# Patient Record
Sex: Female | Born: 1935 | Race: White | Hispanic: No | Smoking: Never smoker
Health system: Southern US, Community
[De-identification: ages and names within clinical notes are randomized; demographics above are authoritative.]

## PROBLEM LIST (undated history)

## (undated) DIAGNOSIS — J9600 Acute respiratory failure, unspecified whether with hypoxia or hypercapnia: Secondary | ICD-10-CM

## (undated) DIAGNOSIS — N183 Chronic kidney disease, stage 3 unspecified: Secondary | ICD-10-CM

## (undated) DIAGNOSIS — I4891 Unspecified atrial fibrillation: Secondary | ICD-10-CM

## (undated) DIAGNOSIS — I10 Essential (primary) hypertension: Secondary | ICD-10-CM

## (undated) DIAGNOSIS — R0600 Dyspnea, unspecified: Secondary | ICD-10-CM

## (undated) DIAGNOSIS — M1990 Unspecified osteoarthritis, unspecified site: Secondary | ICD-10-CM

## (undated) DIAGNOSIS — E119 Type 2 diabetes mellitus without complications: Secondary | ICD-10-CM

## (undated) DIAGNOSIS — E785 Hyperlipidemia, unspecified: Secondary | ICD-10-CM

## (undated) DIAGNOSIS — F0390 Unspecified dementia without behavioral disturbance: Secondary | ICD-10-CM

## (undated) DIAGNOSIS — K219 Gastro-esophageal reflux disease without esophagitis: Secondary | ICD-10-CM

## (undated) HISTORY — PX: REPLACEMENT TOTAL KNEE BILATERAL: SUR1225

## (undated) HISTORY — PX: JOINT REPLACEMENT: SHX530

## (undated) HISTORY — PX: APPENDECTOMY: SHX54

---

## 2016-11-18 DIAGNOSIS — J9600 Acute respiratory failure, unspecified whether with hypoxia or hypercapnia: Secondary | ICD-10-CM

## 2016-11-18 HISTORY — DX: Acute respiratory failure, unspecified whether with hypoxia or hypercapnia: J96.00

## 2016-12-06 ENCOUNTER — Inpatient Hospital Stay (HOSPITAL_COMMUNITY)
Admission: AD | Admit: 2016-12-06 | Discharge: 2016-12-11 | DRG: 871 | Disposition: A | Discharge status: Skilled Nursing Facility | Payer: Medicare Other | Source: Other Acute Inpatient Hospital | Attending: Internal Medicine | Admitting: Internal Medicine

## 2016-12-06 ENCOUNTER — Inpatient Hospital Stay (HOSPITAL_COMMUNITY): Payer: Medicare Other

## 2016-12-06 ENCOUNTER — Encounter (HOSPITAL_COMMUNITY): Payer: Self-pay | Admitting: Pulmonary Disease

## 2016-12-06 DIAGNOSIS — I4891 Unspecified atrial fibrillation: Secondary | ICD-10-CM | POA: Diagnosis not present

## 2016-12-06 DIAGNOSIS — I130 Hypertensive heart and chronic kidney disease with heart failure and stage 1 through stage 4 chronic kidney disease, or unspecified chronic kidney disease: Secondary | ICD-10-CM | POA: Diagnosis present

## 2016-12-06 DIAGNOSIS — R262 Difficulty in walking, not elsewhere classified: Secondary | ICD-10-CM

## 2016-12-06 DIAGNOSIS — N183 Chronic kidney disease, stage 3 (moderate): Secondary | ICD-10-CM | POA: Diagnosis present

## 2016-12-06 DIAGNOSIS — E785 Hyperlipidemia, unspecified: Secondary | ICD-10-CM | POA: Diagnosis present

## 2016-12-06 DIAGNOSIS — Z886 Allergy status to analgesic agent status: Secondary | ICD-10-CM | POA: Diagnosis not present

## 2016-12-06 DIAGNOSIS — I5041 Acute combined systolic (congestive) and diastolic (congestive) heart failure: Secondary | ICD-10-CM | POA: Diagnosis present

## 2016-12-06 DIAGNOSIS — I214 Non-ST elevation (NSTEMI) myocardial infarction: Secondary | ICD-10-CM | POA: Diagnosis present

## 2016-12-06 DIAGNOSIS — I5021 Acute systolic (congestive) heart failure: Secondary | ICD-10-CM | POA: Diagnosis not present

## 2016-12-06 DIAGNOSIS — J9621 Acute and chronic respiratory failure with hypoxia: Secondary | ICD-10-CM | POA: Diagnosis present

## 2016-12-06 DIAGNOSIS — I10 Essential (primary) hypertension: Secondary | ICD-10-CM | POA: Diagnosis not present

## 2016-12-06 DIAGNOSIS — I255 Ischemic cardiomyopathy: Secondary | ICD-10-CM | POA: Diagnosis not present

## 2016-12-06 DIAGNOSIS — J9620 Acute and chronic respiratory failure, unspecified whether with hypoxia or hypercapnia: Secondary | ICD-10-CM

## 2016-12-06 DIAGNOSIS — J189 Pneumonia, unspecified organism: Secondary | ICD-10-CM | POA: Diagnosis present

## 2016-12-06 DIAGNOSIS — Z8249 Family history of ischemic heart disease and other diseases of the circulatory system: Secondary | ICD-10-CM | POA: Diagnosis not present

## 2016-12-06 DIAGNOSIS — J181 Lobar pneumonia, unspecified organism: Secondary | ICD-10-CM | POA: Diagnosis not present

## 2016-12-06 DIAGNOSIS — I4892 Unspecified atrial flutter: Secondary | ICD-10-CM | POA: Diagnosis not present

## 2016-12-06 DIAGNOSIS — A419 Sepsis, unspecified organism: Principal | ICD-10-CM | POA: Diagnosis present

## 2016-12-06 DIAGNOSIS — J9601 Acute respiratory failure with hypoxia: Secondary | ICD-10-CM | POA: Diagnosis not present

## 2016-12-06 DIAGNOSIS — J9600 Acute respiratory failure, unspecified whether with hypoxia or hypercapnia: Secondary | ICD-10-CM

## 2016-12-06 DIAGNOSIS — E1122 Type 2 diabetes mellitus with diabetic chronic kidney disease: Secondary | ICD-10-CM | POA: Diagnosis present

## 2016-12-06 DIAGNOSIS — F0390 Unspecified dementia without behavioral disturbance: Secondary | ICD-10-CM | POA: Diagnosis present

## 2016-12-06 DIAGNOSIS — Z66 Do not resuscitate: Secondary | ICD-10-CM | POA: Diagnosis present

## 2016-12-06 DIAGNOSIS — R0600 Dyspnea, unspecified: Secondary | ICD-10-CM

## 2016-12-06 DIAGNOSIS — E872 Acidosis: Secondary | ICD-10-CM | POA: Diagnosis present

## 2016-12-06 DIAGNOSIS — Z96653 Presence of artificial knee joint, bilateral: Secondary | ICD-10-CM | POA: Diagnosis present

## 2016-12-06 DIAGNOSIS — N179 Acute kidney failure, unspecified: Secondary | ICD-10-CM | POA: Diagnosis present

## 2016-12-06 DIAGNOSIS — I272 Pulmonary hypertension, unspecified: Secondary | ICD-10-CM | POA: Diagnosis present

## 2016-12-06 DIAGNOSIS — R0602 Shortness of breath: Secondary | ICD-10-CM | POA: Diagnosis present

## 2016-12-06 HISTORY — DX: Gastro-esophageal reflux disease without esophagitis: K21.9

## 2016-12-06 HISTORY — DX: Unspecified atrial fibrillation: I48.91

## 2016-12-06 HISTORY — DX: Unspecified dementia, unspecified severity, without behavioral disturbance, psychotic disturbance, mood disturbance, and anxiety: F03.90

## 2016-12-06 HISTORY — DX: Dyspnea, unspecified: R06.00

## 2016-12-06 HISTORY — DX: Acute respiratory failure, unspecified whether with hypoxia or hypercapnia: J96.00

## 2016-12-06 HISTORY — DX: Chronic kidney disease, stage 3 (moderate): N18.3

## 2016-12-06 HISTORY — DX: Hyperlipidemia, unspecified: E78.5

## 2016-12-06 HISTORY — DX: Chronic kidney disease, stage 3 unspecified: N18.30

## 2016-12-06 HISTORY — DX: Essential (primary) hypertension: I10

## 2016-12-06 HISTORY — DX: Unspecified osteoarthritis, unspecified site: M19.90

## 2016-12-06 HISTORY — DX: Type 2 diabetes mellitus without complications: E11.9

## 2016-12-06 LAB — LACTIC ACID, PLASMA: Lactic Acid, Venous: 3.3 mmol/L (ref 0.5–1.9)

## 2016-12-06 LAB — URINALYSIS, ROUTINE W REFLEX MICROSCOPIC
Bilirubin Urine: NEGATIVE
Glucose, UA: NEGATIVE mg/dL
Hgb urine dipstick: NEGATIVE
KETONES UR: NEGATIVE mg/dL
Leukocytes, UA: NEGATIVE
Nitrite: NEGATIVE
PROTEIN: 30 mg/dL — AB
SQUAMOUS EPITHELIAL / LPF: NONE SEEN
Specific Gravity, Urine: 1.02 (ref 1.005–1.030)
pH: 5 (ref 5.0–8.0)

## 2016-12-06 LAB — CBC WITH DIFFERENTIAL/PLATELET
Basophils Absolute: 0 10*3/uL (ref 0.0–0.1)
Basophils Relative: 0 %
EOS ABS: 0 10*3/uL (ref 0.0–0.7)
Eosinophils Relative: 0 %
HEMATOCRIT: 35.1 % — AB (ref 36.0–46.0)
HEMOGLOBIN: 11.5 g/dL — AB (ref 12.0–15.0)
LYMPHS ABS: 0.5 10*3/uL — AB (ref 0.7–4.0)
Lymphocytes Relative: 9 %
MCH: 28.1 pg (ref 26.0–34.0)
MCHC: 32.8 g/dL (ref 30.0–36.0)
MCV: 85.8 fL (ref 78.0–100.0)
MONOS PCT: 2 %
Monocytes Absolute: 0.1 10*3/uL (ref 0.1–1.0)
NEUTROS ABS: 5 10*3/uL (ref 1.7–7.7)
NEUTROS PCT: 89 %
Platelets: 172 10*3/uL (ref 150–400)
RBC: 4.09 MIL/uL (ref 3.87–5.11)
RDW: 15.8 % — ABNORMAL HIGH (ref 11.5–15.5)
WBC: 5.6 10*3/uL (ref 4.0–10.5)

## 2016-12-06 LAB — STREP PNEUMONIAE URINARY ANTIGEN: STREP PNEUMO URINARY ANTIGEN: NEGATIVE

## 2016-12-06 LAB — GLUCOSE, CAPILLARY
GLUCOSE-CAPILLARY: 358 mg/dL — AB (ref 65–99)
GLUCOSE-CAPILLARY: 359 mg/dL — AB (ref 65–99)
GLUCOSE-CAPILLARY: 268 mg/dL — AB (ref 65–99)
Glucose-Capillary: 183 mg/dL — ABNORMAL HIGH (ref 65–99)
Glucose-Capillary: 155 mg/dL — ABNORMAL HIGH (ref 65–99)
Glucose-Capillary: 155 mg/dL — ABNORMAL HIGH (ref 65–99)

## 2016-12-06 LAB — ECHOCARDIOGRAM COMPLETE
HEIGHTINCHES: 65 in
WEIGHTICAEL: 2843.05 [oz_av]

## 2016-12-06 LAB — POCT I-STAT 3, ART BLOOD GAS (G3+)
Acid-base deficit: 8 mmol/L — ABNORMAL HIGH (ref 0.0–2.0)
Bicarbonate: 16.2 mmol/L — ABNORMAL LOW (ref 20.0–28.0)
O2 Saturation: 99 %
PCO2 ART: 27.5 mmHg — AB (ref 32.0–48.0)
PH ART: 7.379 (ref 7.350–7.450)
PO2 ART: 125 mmHg — AB (ref 83.0–108.0)
Patient temperature: 98.6
TCO2: 17 mmol/L (ref 0–100)

## 2016-12-06 LAB — TROPONIN I
TROPONIN I: 52.87 ng/mL — AB (ref <0.03)
Troponin I: 21.62 ng/mL (ref <0.03)
Troponin I: 59.09 ng/mL (ref <0.03)

## 2016-12-06 LAB — INFLUENZA PANEL BY PCR (TYPE A & B)
Influenza A By PCR: NEGATIVE
Influenza B By PCR: NEGATIVE

## 2016-12-06 LAB — BRAIN NATRIURETIC PEPTIDE: B Natriuretic Peptide: 1681.8 pg/mL — ABNORMAL HIGH (ref 0.0–100.0)

## 2016-12-06 LAB — PHOSPHORUS: Phosphorus: 4 mg/dL (ref 2.5–4.6)

## 2016-12-06 LAB — COMPREHENSIVE METABOLIC PANEL
ALBUMIN: 3.1 g/dL — AB (ref 3.5–5.0)
ALK PHOS: 77 U/L (ref 38–126)
ALT: 33 U/L (ref 14–54)
AST: 106 U/L — AB (ref 15–41)
Anion gap: 16 — ABNORMAL HIGH (ref 5–15)
BILIRUBIN TOTAL: 1.6 mg/dL — AB (ref 0.3–1.2)
BUN: 59 mg/dL — AB (ref 6–20)
CO2: 17 mmol/L — ABNORMAL LOW (ref 22–32)
CREATININE: 2.1 mg/dL — AB (ref 0.44–1.00)
Calcium: 8.3 mg/dL — ABNORMAL LOW (ref 8.9–10.3)
Chloride: 98 mmol/L — ABNORMAL LOW (ref 101–111)
GFR calc Af Amer: 24 mL/min — ABNORMAL LOW (ref >60)
GFR, EST NON AFRICAN AMERICAN: 21 mL/min — AB (ref >60)
GLUCOSE: 347 mg/dL — AB (ref 65–99)
POTASSIUM: 5 mmol/L (ref 3.5–5.1)
Sodium: 131 mmol/L — ABNORMAL LOW (ref 135–145)
Total Protein: 6.8 g/dL (ref 6.5–8.1)

## 2016-12-06 LAB — PROCALCITONIN: PROCALCITONIN: 1.27 ng/mL

## 2016-12-06 LAB — MAGNESIUM: MAGNESIUM: 1.8 mg/dL (ref 1.7–2.4)

## 2016-12-06 LAB — MRSA PCR SCREENING: MRSA by PCR: NEGATIVE

## 2016-12-06 LAB — HEPARIN LEVEL (UNFRACTIONATED): Heparin Unfractionated: 0.3 IU/mL (ref 0.30–0.70)

## 2016-12-06 MED ORDER — ORAL CARE MOUTH RINSE
15.0000 mL | Freq: Two times a day (BID) | OROMUCOSAL | Status: DC
  Administered 2016-12-06 – 2016-12-10 (×6): 15 mL via OROMUCOSAL

## 2016-12-06 MED ORDER — HEPARIN SODIUM (PORCINE) 5000 UNIT/ML IJ SOLN: 5000.0000 [IU] | Freq: Three times a day (TID) | INTRAMUSCULAR | Status: DC

## 2016-12-06 MED ORDER — FENTANYL CITRATE (PF) 100 MCG/2ML IJ SOLN
INTRAMUSCULAR | Status: AC
  Filled 2016-12-06: qty 2

## 2016-12-06 MED ORDER — ONDANSETRON HCL 4 MG/2ML IJ SOLN: 4.0000 mg | Freq: Four times a day (QID) | INTRAMUSCULAR | Status: DC | PRN

## 2016-12-06 MED ORDER — SODIUM CHLORIDE 0.9 % IV SOLN
INTRAVENOUS | Status: DC
  Administered 2016-12-06: 08:00:00 via INTRAVENOUS

## 2016-12-06 MED ORDER — CHLORHEXIDINE GLUCONATE 0.12 % MT SOLN
15.0000 mL | Freq: Two times a day (BID) | OROMUCOSAL | Status: DC
  Administered 2016-12-06 – 2016-12-11 (×10): 15 mL via OROMUCOSAL
  Filled 2016-12-06 (×8): qty 15

## 2016-12-06 MED ORDER — SODIUM CHLORIDE 0.9 % IV SOLN: 250.0000 mL | INTRAVENOUS | Status: DC | PRN

## 2016-12-06 MED ORDER — ACETAMINOPHEN 325 MG PO TABS: 650.0000 mg | ORAL_TABLET | ORAL | Status: DC | PRN

## 2016-12-06 MED ORDER — FUROSEMIDE 10 MG/ML IJ SOLN
60.0000 mg | Freq: Two times a day (BID) | INTRAMUSCULAR | Status: DC
  Administered 2016-12-06 – 2016-12-10 (×9): 60 mg via INTRAVENOUS
  Filled 2016-12-06 (×11): qty 6

## 2016-12-06 MED ORDER — LEVOFLOXACIN IN D5W 500 MG/100ML IV SOLN
500.0000 mg | INTRAVENOUS | Status: DC
  Administered 2016-12-08 – 2016-12-09 (×2): 500 mg via INTRAVENOUS
  Filled 2016-12-06 (×2): qty 100

## 2016-12-06 MED ORDER — HEPARIN BOLUS VIA INFUSION
4000.0000 [IU] | Freq: Once | INTRAVENOUS | Status: AC
  Administered 2016-12-06: 4000 [IU] via INTRAVENOUS
  Filled 2016-12-06: qty 4000

## 2016-12-06 MED ORDER — INSULIN ASPART 100 UNIT/ML ~~LOC~~ SOLN
0.0000 [IU] | SUBCUTANEOUS | Status: DC
  Administered 2016-12-06: 2 [IU] via SUBCUTANEOUS
  Administered 2016-12-06: 5 [IU] via SUBCUTANEOUS
  Administered 2016-12-06: 9 [IU] via SUBCUTANEOUS

## 2016-12-06 MED ORDER — NITROGLYCERIN IN D5W 200-5 MCG/ML-% IV SOLN
0.0000 ug/min | INTRAVENOUS | Status: DC
  Administered 2016-12-06: 5 ug/min via INTRAVENOUS
  Filled 2016-12-06 (×2): qty 250

## 2016-12-06 MED ORDER — HEPARIN (PORCINE) IN NACL 100-0.45 UNIT/ML-% IJ SOLN
1250.0000 [IU]/h | INTRAMUSCULAR | Status: DC
  Administered 2016-12-06: 850 [IU]/h via INTRAVENOUS
  Administered 2016-12-07: 950 [IU]/h via INTRAVENOUS
  Administered 2016-12-09: 1250 [IU]/h via INTRAVENOUS
  Filled 2016-12-06 (×4): qty 250

## 2016-12-06 MED ORDER — INSULIN ASPART 100 UNIT/ML ~~LOC~~ SOLN: 1.0000 [IU] | SUBCUTANEOUS | Status: DC

## 2016-12-06 MED ORDER — FENTANYL CITRATE (PF) 100 MCG/2ML IJ SOLN
12.5000 ug | Freq: Once | INTRAMUSCULAR | Status: AC
  Administered 2016-12-06: 12.5 ug via INTRAVENOUS

## 2016-12-06 MED ORDER — IPRATROPIUM-ALBUTEROL 0.5-2.5 (3) MG/3ML IN SOLN
3.0000 mL | Freq: Four times a day (QID) | RESPIRATORY_TRACT | Status: DC
  Administered 2016-12-06 – 2016-12-07 (×6): 3 mL via RESPIRATORY_TRACT
  Filled 2016-12-06 (×6): qty 3

## 2016-12-06 MED ORDER — INSULIN ASPART 100 UNIT/ML ~~LOC~~ SOLN
2.0000 [IU] | SUBCUTANEOUS | Status: DC
  Administered 2016-12-06 – 2016-12-07 (×4): 4 [IU] via SUBCUTANEOUS
  Administered 2016-12-07: 2 [IU] via SUBCUTANEOUS

## 2016-12-06 MED ORDER — DEXTROSE 5 % IV SOLN
500.0000 mg | INTRAVENOUS | Status: DC
  Filled 2016-12-06: qty 500

## 2016-12-06 NOTE — Progress Notes (Signed)
Pharmacy Antibiotic Note  Tammy Fitzgerald is a 83 y.o. female admitted on 12/06/2016 with pneumonia.    Plan: Levoflox 500 mg q48H beginning 1/21 0000 (initial dose 0000 1/19) Monitor cx, renal fx, LOT  Height: 5\' 5"  (165.1 cm) Weight: 177 lb 11.1 oz (80.6 kg) IBW/kg (Calculated) : 57  Temp (24hrs), Avg:98.9 F (37.2 C), Min:98.9 F (37.2 C), Max:98.9 F (37.2 C)   Recent Labs Lab 12/06/16 0940 12/06/16 0941  WBC 5.6  --   CREATININE 2.10*  --   LATICACIDVEN  --  3.3*    Estimated Creatinine Clearance: 22 mL/min (by C-G formula based on SCr of 2.1 mg/dL (H)).    Allergies  Allergen Reactions  . Meperidine And Related Anaphylaxis  . Penicillins Anaphylaxis  . Aspirin    Isaac BlissMichael Lemon Sternberg, PharmD, BCPS, BCCCP Clinical Pharmacist Clinical phone for 12/06/2016 from 7a-3:30p: [phone] If after 3:30p, please call main pharmacy at: [phone] 12/06/2016 10:56 AM

## 2016-12-06 NOTE — Consult Note (Signed)
Cardiology Consult    Patient ID: Michiel CowboyDixie Queenan MRN: [id number], DOB/AGE: [birth year]   Admit date: 12/06/2016 Date of Consult: 12/06/2016  Primary Physician: No primary care provider on file. Reason for Consult: NSTEMI Primary Cardiologist[facility] Requesting Provider: Dr. Delton CoombesByrum   History of Present Illness    Michiel CowboyDixie Mawhinney is a 83 y.o. female with past medical history of HTN, HLD, Type 2 DM, and no prior cardiac history who was transferred to [facility] on 12/06/2016 for acute hypoxic respiratory failure.   She was [facility] at [facility] on 12/05/2016 for acute dyspnea. O2 saturations in the mid-80's and requiring BiPAP upon admission. Initial Lactic Acid 6.4 with troponin of 4.18. [name] via [employer] on 12/06/2016.   Labs today show a WBC 5.6, Hgb 11.5, and platelets 172. Na+ 131, K+ 5.0. Creatinine 2.10. BNP 1681.Repeat Troponin of 21.62. EKG shows sinus tachycardia, HR 124, with LAD and ST depression in the lateral leads. CXR shows evidence of congestive heart failure with alveolar edema versus superimposed pneumonia in the bases.  She has been started on Levaquin for likely CAP and Heparin and IV NTG for NSTEMI. Receiving IV Lasix at 60mg  BID.  In talking with the patient today, she reports having worsening dyspnea with exertion and orthopnea for the past 2 weeks. Has also experienced intermittent episodes of chest discomfort occurring at rest and with exertion. She denies any pain at this time. Remains on BiPAP but says her breathing is improving.  No known prior cardiac history. Says her mother had CAD. She denies any prior tobacco use or alcohol use.   Past Medical History   Past Medical History:  Diagnosis Date  . Diabetes (HCC)   . Hypertension     Past Surgical History:  Procedure Laterality Date  . APPENDECTOMY    . JOINT REPLACEMENT    . REPLACEMENT TOTAL KNEE BILATERAL       Allergies  Allergies    Allergen Reactions  . Meperidine And Related Anaphylaxis  . Penicillins Anaphylaxis  . Aspirin     Inpatient Medications    . chlorhexidine  15 mL Mouth Rinse BID  . furosemide  60 mg Intravenous BID  . insulin aspart  0-9 Units Subcutaneous Q4H  . insulin aspart  2-6 Units Subcutaneous Q4H  . ipratropium-albuterol  3 mL Nebulization Q6H  . [START ON 12/08/2016] levofloxacin (LEVAQUIN) IV  500 mg Intravenous Q48H  . mouth rinse  15 mL Mouth Rinse q12n4p   . sodium chloride Stopped (12/06/16 0904)  . heparin 850 Units/hr (12/06/16 1012)  . nitroGLYCERIN 5 mcg/min (12/06/16 1011)   Family History    Family History  Problem Relation Age of Onset  . CAD Mother   . Diabetes Father   . CAD Daughter     Social History    Social History   Social History  . Marital status: Widowed    Spouse name: N/A  . Number of children: N/A  . Years of education: N/A   Occupational History  . Not on file.   Social History Main Topics  . Smoking status: Never Smoker  . Smokeless tobacco: Never Used  . Alcohol use No  . Drug use: No  . Sexual activity: Not on file   Other Topics Concern  . Not on file   Social History Narrative  . No narrative on file     Review of Systems    General:  No night sweats or weight changes. Positive for fever and chills.  Cardiovascular:  No palpitations, paroxysmal nocturnal dyspnea. Positive for chest pain, dyspnea on exertion, orthopnea, and lower extremity edema.  Dermatological: No rash, lesions/masses Respiratory: No cough, Positive for dyspnea. Urologic: No hematuria, dysuria Abdominal:   No nausea, vomiting, diarrhea, bright red blood per rectum, melena, or hematemesis Neurologic:  No visual changes, wkns, changes in mental status. All other systems reviewed and are otherwise negative except as noted above.  Physical Exam    Blood pressure 101/65, pulse (!) 107, temperature 97.7 F (36.5 C), temperature source Oral, resp. rate (!)  24, height 5\' 5"  (1.651 m), weight 177 lb 11.1 oz (80.6 kg), SpO2 98 %.  General: Pleasant, elderly Caucasian female appearing in NAD. Currently on BiPAP. Psych: Normal affect. Neuro: Alert and oriented X 3. Moves all extremities spontaneously. HEENT: Normal  Neck: Supple without bruits. JVD at 9cm. Lungs:  Resp regular and unlabored, rales at bases bilaterally. Heart: Regular rhythm, tachycardiac rate, no s3, s4, or murmurs. Abdomen: Soft, non-tender, non-distended, BS + x 4.  Extremities: No clubbing or cyanosis. 1+ pitting edema bilaterally. DP/PT/Radials 2+ and equal bilaterally.  Labs    Troponin (Point of Care Test) No results for input(s): TROPIPOC in the last 72 hours.  Recent Labs  12/06/16 0940  TROPONINI 21.62*   Lab Results  Component Value Date   WBC 5.6 12/06/2016   HGB 11.5 (L) 12/06/2016   HCT 35.1 (L) 12/06/2016   MCV 85.8 12/06/2016   PLT 172 12/06/2016     Recent Labs Lab 12/06/16 0940  NA 131*  K 5.0  CL 98*  CO2 17*  BUN 59*  CREATININE 2.10*  CALCIUM 8.3*  PROT 6.8  BILITOT 1.6*  ALKPHOS 77  ALT 33  AST 106*  GLUCOSE 347*   No results found for: CHOL, HDL, LDLCALC, TRIG No results found for: [facility]   Radiology Studies    Dg Chest Port 1 View  Result Date: 12/06/2016 CLINICAL DATA:  Shortness of Breath EXAM: PORTABLE CHEST 1 VIEW COMPARISON:  None. FINDINGS: There is widespread interstitial edema. There are small pleural effusions bilaterally. There is patchy airspace consolidation in the bases. There is cardiomegaly with pulmonary venous hypertension. There is aortic atherosclerosis. There is an apparent hiatal type hernia. No adenopathy is appreciable. There is degenerative change in each shoulder. IMPRESSION: Evidence of congestive heart failure. Question alveolar edema versus superimposed pneumonia in the bases. Both entities may exist concurrently. There is aortic atherosclerosis. Apparent hiatal type hernia. Electronically Signed   By:  Bretta Bang III M.D.   On: 12/06/2016 07:16    EKG & Cardiac Imaging    EKG: Sinus tachycardia, HR 124, with LAD and ST depression in the lateral leads.  Echocardiogram: Pending  Assessment & Plan    1. NSTEMI - reports episodes of dyspnea with exertion and chest pain for the past two weeks.  - initial troponin elevated at 4.18 with repeat value of 21.62. EKG shows sinus tachycardia, HR 124, with LAD and ST depression in the lateral leads.  - no known cardiac history.  - with her current respiratory status and AKI, she is not a cardiac catheterization candidate at this time. Continue Heparin and IV NTG (attempt to wean in the setting of hypotension and if pain is controlled). Will obtain an echocardiogram to assess LV function and wall motion. Likely cath early next week pending improvement in her acute symptoms.  - no BB for now  in the setting of hypotension. No statin therapy with elevated LFT's.   2. Acute Hypoxic Respiratory Failure in the setting of CAP and CHF - admitted with acute dyspnea, requiring BiPAP as initial saturations in the 80's.  -  BNP at 1681 with CXR showing evidence of congestive heart failure with alveolar edema versus superimposed pneumonia in the bases. - started on Levaquin for likely CAP receiving IV Lasix at 60mg  BID. - will obtain echocardiogram to assess LV function and wall motion, as to further classify type of CHF. - continue with IV diuresis as she still appears volume overloaded on exam. Strict I&O's and daily weights.   3. HLD - on Simvastatin 40mg  daily prior to admission. AST elevated at 106, ALT 33. - plan to resume statin therapy once LFT's normalize.  4. HTN - actually hypotensive at 77/60 - 107/80 since admission.  - no BB at this time.   5. AKI - creatinine 2.3 at outside hospital, at 2.10 this AM. - repeat BMET in AM.   Signed, Ellsworth Lennox, PA-C 12/06/2016, 12:38 PM Pager: [phone]  The patient was seen, examined  and discussed with Randall An, PA-C and I agree with the above.    A very pleasant 83 year old female with h/o HTN, HLD, Type 2 DM, and no prior cardiac history who was transferred to [facility] on 12/06/2016 for acute hypoxic respiratory failure. She was admitted at [facility] on 12/05/2016 for acute dyspnea. O2 saturations in the mid-80's and requiring BiPAP upon admission. Initial Lactic Acid 6.4 with troponin of 4.18. Was transferred to [facility] evaluation via [employer] on 12/06/2016.  She was found to have NSTEMI with troponin 21-->52, on Heparin drip, NTG drip, no chest pain but SOB  With labored breathing on BiPAP.  EKG shows sinus tachycardia, HR 124, Q wave in the anterior leads and inferolateral ST depressions.  CXR shows evidence of congestive heart failure with alveolar edema versus superimposed pneumonia in the bases. She has been started on Levaquin for likely CAP and Heparin and IV NTG for NSTEMI. Receiving IV Lasix at 60mg  BID. Crea 2.0, only one we have.  LVEF 25-30%.  The patient admits to worsening dyspnea with exertion and orthopnea for the past 2 weeks with and without exertion. However the daughter states that she mistaken that with her as the patient has dementia.  Ideally we should perform a left and right cardiac cath, however with stage 4 kidney failure she is at high risk for contrast nephrotoxicity. I have discussed it with the daughter who is her decision maker, she states that her mother has had stage 3 kidney failure for many years. In her advanced age, dementia and advanced kidney failure they would really not want her to be on HD. The daughter wishes for her mom to be treated medically with potential cath later if her kidneys improve. The patient agrees.  I have spent total 110 minutes with the patient and discussion with her daughter.   Tobias Alexander, MD 12/06/2016

## 2016-12-06 NOTE — Progress Notes (Signed)
Inpatient Diabetes Program Recommendations  AACE/ADA: New Consensus Statement on Inpatient Glycemic Control (2015)  Target Ranges:  Prepandial:   less than 140 mg/dL      Peak postprandial:   less than 180 mg/dL (1-2 hours)      Critically ill patients:  140 - 180 mg/dL   Lab Results  Component Value Date   GLUCAP 359 (H) 12/06/2016    Review of Glycemic Control:  Results for Michiel CowboyECE, Clytee (MRN [id number]) as of 12/06/2016 09:20  Ref. Range 12/06/2016 05:51 12/06/2016 08:27  Glucose-Capillary Latest Ref Range: 65 - 99 mg/dL 440358 (H) 102359 (H)    Inpatient Diabetes Program Recommendations:    Agree with current orders for ICU glycemic control order set.  Likely will need IV insulin based on protocol.  Will follow. Thanks, Beryl MeagerJenny Rayanna Matusik, RN, BC-ADM Inpatient Diabetes Coordinator Pager [phone] (8a-5p)

## 2016-12-06 NOTE — H&P (Addendum)
PULMONARY / CRITICAL CARE MEDICINE   Name: Tammy Fitzgerald MRN: [id number] DOB: Apr 08, 1933    ADMISSION DATE:  12/06/2016 CONSULTATION DATE:    REFERRING MD:    CHIEF COMPLAINT:  Shortness of breath  HISTORY OF PRESENT ILLNESS:   Tammy Fitzgerald,83 y.o lady, with PMH of DM and HTN, transferred from [facility] with complaint of worsening shortness of breath and generalized weakness. According to patient she had worsening shortness of breath going on for a 1-2 weeks, Patient also complained of pleuritic right sided chest pain, occasionally radiating to right shoulder, aggravated by change in position, no alleviating factor. Also complained of orthopnea and PND going on for couple of weeks. She also endorsed chronic lower extremity swelling. She also complained of some productive cough of white sputum for last 2-3 weeks. She also endorses 2-3 episodes of diarrhea 2 days ago. Denies any nasal congestion, generalized body aches and pains. She also endorses  Fever at home (According to pt. Measured by daughter but she doesn't know the number).She was afebrile in [facility] and with us. She denies any N/V,abdominal pain, dysuria or hematuria. She was brought to [facility] by her daughter yesterday, where she was found to have lactic acid at 6.4 ,trop. 4.1,BNP 13446, BUN 57,and Cr. 2.3. she was [facility] for further care and management.  History was obtained from patient.  PAST MEDICAL HISTORY :  She  has a past medical history of Diabetes (HCC) and Hypertension.  PAST SURGICAL HISTORY: She has history of bilateral knee replacement and appendectomy.  Allergies  Allergen Reactions  . Meperidine And Related Anaphylaxis  . Penicillins Anaphylaxis  . Aspirin     No current facility-administered medications on file prior to encounter.    No current outpatient prescriptions on file prior to encounter.    FAMILY HISTORY:  Her indicated that her mother is  deceased. She indicated that her father is deceased. She indicated that her daughter is alive.    SOCIAL HISTORY: She  reports that she has never smoked. She has never used smokeless tobacco. She reports that she does not drink alcohol or use drugs.  REVIEW OF SYSTEMS:   A complete ROS was negative except as per HPI.    SUBJECTIVE:  She complained of worsening shortness of breath. She was unable to speak in full sentences without being short of breath.  VITAL SIGNS: BP 100/70 (BP Location: Left Arm)   Pulse (!) 111   Resp (!) 33   SpO2 97%   HEMODYNAMICS:    VENTILATOR SETTINGS: Vent Mode: BIPAP FiO2 (%):  [40 %] 40 % Set Rate:  [15 bmp] 15 bmp  INTAKE / OUTPUT: No intake/output data recorded.  PHYSICAL EXAMINATION: General:  Well developed, pleasant lady, in mild distress. Neuro: Alert and oriented, cranial nerve grossly intact, strength and sensations grossly normal bilaterally. HEENT:  PERRL, neck supple, no scleral icterus, no JVD. Cardiovascular: Tachycardia, No R/M/G. Lungs: Tachypnoic, Decreased breath sounds at bases with few scattered wheezes. Abdomen:  Soft, nontender, nondistended, bowel sounds positive. Musculoskeletal:  1+ LE edema B/L, no cyanosis, no obvious deformity. Skin:  Dry, warm , no rash.  LABS:  BMET No results for input(s): NA, K, CL, CO2, BUN, CREATININE, GLUCOSE in the last 168 hours.  Electrolytes No results for input(s): CALCIUM, MG, PHOS in the last 168 hours.  CBC No results for input(s): WBC, HGB, HCT, PLT in the last 168 hours.  Coag's No results for input(s): APTT, INR in the  last 168 hours.  Sepsis Markers No results for input(s): LATICACIDVEN, PROCALCITON, O2SATVEN in the last 168 hours.  ABG No results for input(s): PHART, PCO2ART, PO2ART in the last 168 hours.  Liver Enzymes No results for input(s): AST, ALT, ALKPHOS, BILITOT, ALBUMIN in the last 168 hours.  Cardiac Enzymes No results for input(s): TROPONINI, PROBNP  in the last 168 hours.  Glucose  Recent Labs Lab 12/06/16 0551  GLUCAP 358*    Imaging Dg Chest Port 1 View  Result Date: 12/06/2016 CLINICAL DATA:  Shortness of Breath EXAM: PORTABLE CHEST 1 VIEW COMPARISON:  None. FINDINGS: There is widespread interstitial edema. There are small pleural effusions bilaterally. There is patchy airspace consolidation in the bases. There is cardiomegaly with pulmonary venous hypertension. There is aortic atherosclerosis. There is an apparent hiatal type hernia. No adenopathy is appreciable. There is degenerative change in each shoulder. IMPRESSION: Evidence of congestive heart failure. Question alveolar edema versus superimposed pneumonia in the bases. Both entities may exist concurrently. There is aortic atherosclerosis. Apparent hiatal type hernia. Electronically Signed   By: Bretta Bang III M.D.   On: 12/06/2016 07:16     STUDIES:  ECG 1/18>> sinus tachycardia, ST depression in lead 1 aVL V5 and V6. CXR 1/19>> pulmonary vascular congestion/basilar pneumonia.  CULTURES: Blood Culture 1/19>> Sputum Culture 1/19>.  ANTIBIOTICS: Levaquin 1/19>>>  SIGNIFICANT EVENTS: Admitted on 1/19.  LINES/TUBES:   DISCUSSION: Tammy Fitzgerald, Tammy Fitzgerald y.o lady, transferred from [facility] with complaint of worsening shortness of breath and generalized weakness. According to patient she had worsening shortness of breath going on for a few weeks, she was brought to [facility] by her daughter yesterday, where she was found to have O2 sat. In 80es, lactic acid at 6.4 ,trop. 4.1,BNP 13446, BUN 57,and Cr. 2.3. she was transferred to [facility] for care and management.  ASSESSMENT / PLAN:  PULMONARY A: Basal pneumonia vs edema R/o underlying int lung dz  Hypoxic respiratory failure,O2 saturation improved with BiPAP. P:  CBC CXR  Levaquin BiPAP if needed.  CARDIOVASCULAR A:  Mild hypotension-history of hypertension. Pulmonary edema CHF ACS? P:   Trend Trop. BNP Repeat ECG ECHO Consider Lasix  RENAL A:   AK I/CKD (Cr.2.3, baseline unknown) P:   F/U BMET UA Avoid nephrotoxic drugs.  GASTROINTESTINAL A:   No current Issue. P:   Heparin S/C for DVT prophylaxis.  HEMATOLOGIC A:   DVT prophylaxis. P:  Heparin [place].  INFECTIOUS A:   CAP P:   Levaquin   ENDOCRINE A:   DM   P:   CBG SSI  NEUROLOGIC A:   A & O, with some baseline dementia. P:   RASS goal: 0    FAMILY  - Updates: No family at bedside.Pt. Lives with daughter.  - Inter-disciplinary family meet or Palliative Care meeting due by:  1/26.    Pulmonary and Critical Care Medicine [facility] Pager: [phone]  12/06/2016, 8:00 AM   STAFF NOTE: I, Rory Percy, MD FACP have personally reviewed patient's available data, including medical history, events of note, physical examination and test results as part of my evaluation. I have discussed with resident/NP and other care providers such as pharmacist, RN and RRT. In addition, I personally evaluated patient and elicited key findings of: awake, follows commands, scattered wheezing, reduced, jvd is up, some moderate elevation rr, edema lowers 1 plus, clinical concerns are active ischemia with pulm edema, r/o atypical infection on top, currently on BIPAP, plan: heparin drip, hold asa (allergy),  low dose NTG, cards, consider plavix, lasix to neg balance, abg on nimv repeat, NPO, labs at [facility] awaited, lactic repeat, kvo, may need line, maintain BIPAP and schedule 4 hours on 1 hours, echo needed, trop further, I think she will need CT chest at some stage The patient is critically ill with multiple organ systems failure and requires high complexity decision making for assessment and support, frequent evaluation and titration of therapies, application of advanced monitoring technologies and extensive interpretation of multiple databases.   Critical Care Time devoted to patient care services  described in this note is 35 Minutes. This time reflects time of care of this signee: Rory Percy, MD FACP. This critical care time does not reflect procedure time, or teaching time or supervisory time of PA/NP/Med student/Med Resident etc but could involve care discussion time. Rest per NP/medical resident whose note is outlined above and that I agree with   Mcarthur Rossetti. Tyson Alias, MD, FACP Pgr: [phone] [facility] 12/06/2016 8:21 AM

## 2016-12-06 NOTE — Care Management Note (Signed)
Case Management Note  Patient Details  Name: Michiel CowboyDixie Rahn MRN: [id number] Date of Birth: 10-Jul-1933  Subjective/Objective:       Pt admitted with STEMI             Action/Plan:  PT was airlifted from [facility] on BIPAP.  Pt remains on BIPAP - no family at bedside.  Tentative plan for CATH lab early next week.  CM will continue to follow for discharge needs   Expected Discharge Date:                  Expected Discharge Plan:     In-House Referral:     Discharge planning Services  CM Consult  Post Acute Care Choice:    Choice offered to:     DME Arranged:    DME Agency:     HH Arranged:    HH Agency:     Status of Service:  In process, will continue to follow  If discussed at Long Length of Stay Meetings, dates discussed:    Additional Comments:  Cherylann ParrClaxton, Kissie Ziolkowski S, RN 12/06/2016, 3:29 PM

## 2016-12-06 NOTE — Progress Notes (Addendum)
ANTICOAGULATION CONSULT NOTE - Initial Consult  Pharmacy Consult for heparin Indication: chest pain/ACS  Allergies  Allergen Reactions  . Meperidine And Related Anaphylaxis  . Penicillins Anaphylaxis  . Aspirin     Patient Measurements: Height: 5\' 5"  (165.1 cm) Weight: 177 lb 11.1 oz (80.6 kg) IBW/kg (Calculated) : 57 Heparin Dosing Weight: 74 kg  Vital Signs: Temp: 98.9 F (37.2 C) (01/19 0829) Temp Source: Oral (01/19 0829) BP: 100/70 (01/19 0700) Pulse Rate: 111 (01/19 0700)  Labs: No results for input(s): HGB, HCT, PLT, APTT, LABPROT, INR, HEPARINUNFRC, HEPRLOWMOCWT, CREATININE, CKTOTAL, CKMB, TROPONINI in the last 72 hours.  CrCl cannot be calculated (No order found.).   Assessment: 83 yo f presenting from OSH with SOB, weakness  PMH: DM, HTN  Potentially CHF exacerbation vs ACS?  Initial labs drawn but pending - no AC currently so can begin without result - update BL labs reviewed and WNL other than elevated troponin  Goal of Therapy:  Heparin level 0.3-0.7 units/ml Monitor platelets by anticoagulation protocol: Yes   Plan:  Heparin bolus 4000 units x 1 Heparin gtt 850 units/hr Initial lvl 1800 Daily HL, CBC  Isaac BlissMichael Vestal Crandall, PharmD, BCPS, BCCCP Clinical Pharmacist Clinical phone for 12/06/2016 from 7a-3:30p: [phone] If after 3:30p, please call main pharmacy at: [phone] 12/06/2016 9:29 AM

## 2016-12-06 NOTE — Progress Notes (Signed)
[facility].  Transferred to our vent in NIV mode.  Tolerating well.  Rt will monitor.

## 2016-12-06 NOTE — Progress Notes (Signed)
ANTICOAGULATION CONSULT NOTE  Pharmacy Consult for heparin Indication: chest pain/ACS  Allergies  Allergen Reactions  . Meperidine And Related Anaphylaxis  . Penicillins Anaphylaxis  . Aspirin Hives  . Pneumovax 23 [Pneumococcal Vac Polyvalent] Hives and Swelling    Patient Measurements: Height: 5\' 5"  (165.1 cm) Weight: 177 lb 11.1 oz (80.6 kg) IBW/kg (Calculated) : 57 Heparin Dosing Weight: 74 kg  Vital Signs: Temp: 97.4 F (36.3 C) (01/19 1935) Temp Source: Axillary (01/19 1935) BP: 103/71 (01/19 1930) Pulse Rate: 101 (01/19 1930)  Labs:  Recent Labs  12/06/16 0940 12/06/16 1228 12/06/16 1852  HGB 11.5*  --   --   HCT 35.1*  --   --   PLT 172  --   --   HEPARINUNFRC  --   --  0.30  CREATININE 2.10*  --   --   TROPONINI 21.62* 52.87*  --     Estimated Creatinine Clearance: 22 mL/min (by C-G formula based on SCr of 2.1 mg/dL (H)).   Assessment: 83 yo f presenting from OSH with SOB, weakness  PMH: DM, HTN  Potentially CHF exacerbation vs ACS?  Initial heparin level = 0.30  Goal of Therapy:  Heparin level 0.3-0.7 units/ml Monitor platelets by anticoagulation protocol: Yes   Plan:  Increase heparin to 950 units / hr to prevent drop to less than 0.3 Follow up AM labs  Thank you Okey RegalLisa Phila Shoaf, PharmD [phone] 12/06/2016 7:56 PM

## 2016-12-06 NOTE — Progress Notes (Signed)
CRITICAL VALUE ALERT  Critical value received:  Lactic acid 3.3/Troponin 22.62  Date of notification:  12/06/16  Time of notification:  1026 and 1035 respectively   Critical value read back: Yes  Nurse who received alert: Hazel Samshristian Ronny Korff RN   MD notified (1st page):  MD Tyson AliasFeinstein  Time of first page:  MD on unit  MD notified (2nd page):  Time of second page:  Responding MD: MD Tyson AliasFeinstein  Time MD responded: [phone]

## 2016-12-07 DIAGNOSIS — I4891 Unspecified atrial fibrillation: Secondary | ICD-10-CM

## 2016-12-07 LAB — HEPARIN LEVEL (UNFRACTIONATED): HEPARIN UNFRACTIONATED: 0.33 [IU]/mL (ref 0.30–0.70)

## 2016-12-07 LAB — GLUCOSE, CAPILLARY
GLUCOSE-CAPILLARY: 147 mg/dL — AB (ref 65–99)
Glucose-Capillary: 191 mg/dL — ABNORMAL HIGH (ref 65–99)
Glucose-Capillary: 146 mg/dL — ABNORMAL HIGH (ref 65–99)
Glucose-Capillary: 259 mg/dL — ABNORMAL HIGH (ref 65–99)
Glucose-Capillary: 204 mg/dL — ABNORMAL HIGH (ref 65–99)

## 2016-12-07 LAB — CBC
HCT: 28.9 % — ABNORMAL LOW (ref 36.0–46.0)
Hemoglobin: 9.7 g/dL — ABNORMAL LOW (ref 12.0–15.0)
MCH: 28.3 pg (ref 26.0–34.0)
MCHC: 33.6 g/dL (ref 30.0–36.0)
MCV: 84.3 fL (ref 78.0–100.0)
PLATELETS: 201 10*3/uL (ref 150–400)
RBC: 3.43 MIL/uL — AB (ref 3.87–5.11)
RDW: 15.5 % (ref 11.5–15.5)
WBC: 15 10*3/uL — AB (ref 4.0–10.5)

## 2016-12-07 LAB — PROCALCITONIN: Procalcitonin: 1.27 ng/mL

## 2016-12-07 LAB — BASIC METABOLIC PANEL
Anion gap: 13 (ref 5–15)
BUN: 60 mg/dL — AB (ref 6–20)
CALCIUM: 8.7 mg/dL — AB (ref 8.9–10.3)
CO2: 22 mmol/L (ref 22–32)
CREATININE: 1.84 mg/dL — AB (ref 0.44–1.00)
Chloride: 101 mmol/L (ref 101–111)
GFR calc non Af Amer: 25 mL/min — ABNORMAL LOW (ref >60)
GFR, EST AFRICAN AMERICAN: 29 mL/min — AB (ref >60)
Glucose, Bld: 140 mg/dL — ABNORMAL HIGH (ref 65–99)
Potassium: 3.5 mmol/L (ref 3.5–5.1)
SODIUM: 136 mmol/L (ref 135–145)

## 2016-12-07 MED ORDER — IPRATROPIUM-ALBUTEROL 0.5-2.5 (3) MG/3ML IN SOLN: 3.0000 mL | Freq: Four times a day (QID) | RESPIRATORY_TRACT | Status: DC | PRN

## 2016-12-07 MED ORDER — INSULIN ASPART 100 UNIT/ML ~~LOC~~ SOLN
0.0000 [IU] | Freq: Three times a day (TID) | SUBCUTANEOUS | Status: DC
  Administered 2016-12-07: 5 [IU] via SUBCUTANEOUS
  Administered 2016-12-08: 7 [IU] via SUBCUTANEOUS
  Administered 2016-12-08: 2 [IU] via SUBCUTANEOUS
  Administered 2016-12-08: 1 [IU] via SUBCUTANEOUS
  Administered 2016-12-09 – 2016-12-10 (×4): 5 [IU] via SUBCUTANEOUS
  Administered 2016-12-10: 7 [IU] via SUBCUTANEOUS
  Administered 2016-12-10: 2 [IU] via SUBCUTANEOUS
  Administered 2016-12-11: 3 [IU] via SUBCUTANEOUS
  Administered 2016-12-11: 5 [IU] via SUBCUTANEOUS

## 2016-12-07 MED ORDER — IPRATROPIUM-ALBUTEROL 0.5-2.5 (3) MG/3ML IN SOLN
3.0000 mL | Freq: Three times a day (TID) | RESPIRATORY_TRACT | Status: DC
  Administered 2016-12-08 – 2016-12-11 (×7): 3 mL via RESPIRATORY_TRACT
  Filled 2016-12-07 (×8): qty 3

## 2016-12-07 MED ORDER — MAGNESIUM SULFATE 2 GM/50ML IV SOLN
2.0000 g | Freq: Once | INTRAVENOUS | Status: AC
  Administered 2016-12-07: 2 g via INTRAVENOUS
  Filled 2016-12-07: qty 50

## 2016-12-07 NOTE — Progress Notes (Signed)
SUBJECTIVE: The patient remains quite ill.  He is now in afib (appears to be new).  She denies CP presently and feels that her breathing is "better".   . chlorhexidine  15 mL Mouth Rinse BID  . furosemide  60 mg Intravenous BID  . insulin aspart  0-9 Units Subcutaneous TID WC  . ipratropium-albuterol  3 mL Nebulization Q6H  . [START ON 12/08/2016] levofloxacin (LEVAQUIN) IV  500 mg Intravenous Q48H  . mouth rinse  15 mL Mouth Rinse q12n4p   . sodium chloride 50 mL/hr at 12/07/16 0008  . heparin 950 Units/hr (12/07/16 0538)    OBJECTIVE: Physical Exam: Vitals:   12/07/16 1200 12/07/16 1300 12/07/16 1400 12/07/16 1428  BP: (!) 77/66 (!) 80/55 (!) 74/51   Pulse: (!) 102 (!) 109 (!) 115   Resp: (!) 22 (!) 24 (!) 29   Temp:      TempSrc:      SpO2: 99% 99% 98% 98%  Weight:      Height:        Intake/Output Summary (Last 24 hours) at 12/07/16 1452 Last data filed at 12/07/16 1400  Gross per 24 hour  Intake          1081.53 ml  Output             3295 ml  Net         -2213.47 ml    Telemetry reveals afib with V rates 120s  GEN- The patient is elderly appearing, alert but confused, pleasant  Head- normocephalic, atraumatic Eyes-  Sclera clear, conjunctiva pink Ears- hearing intact Oropharynx- clear Neck- supple, + JVD Lungs- decreased BS at bases, normal work of breathing Heart-tachycardic irregular rhythm GI- soft, NT, ND, + BS Extremities- no clubbing, cyanosis, +1 edema Skin- no rash or lesion Psych- euthymic mood, full affect Neuro- strength and sensation are intact Frail and ill appearing  LABS: Basic Metabolic Panel:  Recent Labs  16/10/96 0940 12/07/16 0214  NA 131* 136  K 5.0 3.5  CL 98* 101  CO2 17* 22  GLUCOSE 347* 140*  BUN 59* 60*  CREATININE 2.10* 1.84*  CALCIUM 8.3* 8.7*  MG 1.8  --   PHOS 4.0  --    Liver Function Tests:  Recent Labs  12/06/16 0940  AST 106*  ALT 33  ALKPHOS 77  BILITOT 1.6*  PROT 6.8  ALBUMIN 3.1*   No  results for input(s): LIPASE, AMYLASE in the last 72 hours. CBC:  Recent Labs  12/06/16 0940 12/07/16 0214  WBC 5.6 15.0*  NEUTROABS 5.0  --   HGB 11.5* 9.7*  HCT 35.1* 28.9*  MCV 85.8 84.3  PLT 172 201   Cardiac Enzymes:  Recent Labs  12/06/16 0940 12/06/16 1228 12/06/16 1852  TROPONINI 21.62* 52.87* 59.09*    RADIOLOGY: Dg Chest Port 1 View  Result Date: 12/06/2016 CLINICAL DATA:  Shortness of Breath EXAM: PORTABLE CHEST 1 VIEW COMPARISON:  None. FINDINGS: There is widespread interstitial edema. There are small pleural effusions bilaterally. There is patchy airspace consolidation in the bases. There is cardiomegaly with pulmonary venous hypertension. There is aortic atherosclerosis. There is an apparent hiatal type hernia. No adenopathy is appreciable. There is degenerative change in each shoulder. IMPRESSION: Evidence of congestive heart failure. Question alveolar edema versus superimposed pneumonia in the bases. Both entities may exist concurrently. There is aortic atherosclerosis. Apparent hiatal type hernia. Electronically Signed   By: Bretta Bang III M.D.   On: 12/06/2016 07:16  Echo is reviewed- EF 25% with diffuse HK, severe MR, severe pulm HTN  ASSESSMENT AND PLAN:   1. NSTEMI Very robust elevation in cardiac markers.  Dr Delton SeeNelson had a long conversation with patient's daughter and a conservative approach was decided upon.  Will continue IV heparin.  She has an ASA allergy.  BP is too low currently for beta blocker.  Will add statin when able.  2. afib with RVR New onset Continue on IV heparin for now BP too soft for rate control at this time Hopefully as agitation improves, her HR will also improve Will follow for now chads2vasc score is at least 7.  Will need to consider anticoagulation prior to discharge  3.  HTN Currently hypotensive  4. Dementia Stable  5. Ischemic CM with severe MR and pulmonary hypertension Supportive care at this time  Her  prognosis is very poor.  She is appropriately DNR. If her condition declines further, would strongly consider palliative options   Hillis RangeJames Bunnie Lederman, MD 12/07/2016 2:52 PM

## 2016-12-07 NOTE — Progress Notes (Signed)
ANTICOAGULATION CONSULT NOTE - Follow Up Consult  Pharmacy Consult for Heparin  Indication: chest pain/ACS  Allergies  Allergen Reactions  . Meperidine And Related Anaphylaxis  . Penicillins Anaphylaxis  . Aspirin Hives  . Pneumovax 23 [Pneumococcal Vac Polyvalent] Hives and Swelling   Patient Measurements: Height: 5\' 5"  (165.1 cm) Weight: 177 lb 11.1 oz (80.6 kg) IBW/kg (Calculated) : 57  Vital Signs: Temp: 99.6 F (37.6 C) (01/20 0317) Temp Source: Axillary (01/20 0317) BP: 94/74 (01/20 0300) Pulse Rate: 103 (01/20 0300)  Labs:  Recent Labs  12/06/16 0940 12/06/16 1228 12/06/16 1852 12/07/16 0214  HGB 11.5*  --   --  9.7*  HCT 35.1*  --   --  28.9*  PLT 172  --   --  201  HEPARINUNFRC  --   --  0.30 0.33  CREATININE 2.10*  --   --  1.84*  TROPONINI 21.62* 52.87* 59.09*  --     Estimated Creatinine Clearance: 25.1 mL/min (by C-G formula based on SCr of 1.84 mg/dL (H)).   Assessment: Heparin for markedly elevated troponin, heparin level therapeutic x 2, medical treatment for now  Goal of Therapy:  Heparin level 0.3-0.7 units/ml Monitor platelets by anticoagulation protocol: Yes   Plan:  -Cont heparin 950 units/hr -Daily CBC/HL -Monitor for bleeding  Abran DukeLedford, Kailen Hinkle 12/07/2016,3:47 AM

## 2016-12-07 NOTE — Progress Notes (Addendum)
PULMONARY / CRITICAL CARE MEDICINE   Name: Tammy Fitzgerald MRN: [id number] DOB: 08-Apr-1933    ADMISSION DATE:  12/06/2016 CONSULTATION DATE:    REFERRING MD:    CHIEF COMPLAINT:  Shortness of breath  brief   Tammy Fitzgerald,83 y.o lady, with PMH of DM and HTN, transferred from [facility] with complaint of worsening shortness of breath and generalized weakness. According to patient she had worsening shortness of breath going on for a 1-2 weeks, Patient also complained of pleuritic right sided chest pain, occasionally radiating to right shoulder, aggravated by change in position, no alleviating factor. Also complained of orthopnea and PND going on for couple of weeks. She also endorsed chronic lower extremity swelling. She also complained of some productive cough of white sputum for last 2-3 weeks. She also endorses 2-3 episodes of diarrhea 2 days ago. Denies any nasal congestion, generalized body aches and pains. She also endorses  Fever at home (According to pt. Measured by daughter but she doesn't know the number).She was afebrile in [facility] and with [place]. She denies any N/V,abdominal pain, dysuria or hematuria. She was brought to [facility] by her daughter yesterday, where she was found to have lactic acid at 6.4 ,trop. 4.1,BNP 13446, BUN 57,and Cr. 2.3. she was transferred to [facility] for further care and management.   STUDIES:  ECG 1/18>> sinus tachycardia, ST depression in lead 1 aVL V5 and V6. CXR 1/19>> pulmonary vascular congestion/basilar pneumonia.  CULTURES: Blood Culture 1/19>> Sputum Culture 1/19>.  ANTIBIOTICS: Levaquin 1/19>>>  SIGNIFICANT EVENTS: Admitted on 1/19.   1/19 - She complained of worsening shortness of breath. She was unable to speak in full sentences without being short of breath. EF 20% on echo. On IV heparin gtt   SUBJECTIVE/OVERNIGHT/INTERVAL HX 1/20 - doing well off bipap this am for several hours. She wants to be off bipap at  day. She is okw ith bipap QHS. On I vheparin  Gtt. Off Iv nitro gtt  VITAL SIGNS: BP (!) 89/54   Pulse (!) 104   Temp 97.8 F (36.6 C) (Oral)   Resp (!) 25   Ht 5\' 5"  (1.651 m)   Wt 78.5 kg (173 lb 1 oz)   SpO2 98%   BMI 28.80 kg/m   HEMODYNAMICS:    VENTILATOR SETTINGS: Vent Mode: BIPAP;PCV FiO2 (%):  [40 %] 40 % Set Rate:  [15 bmp] 15 bmp PEEP:  [6 cmH20] 6 cmH20  INTAKE / OUTPUT: I/O last 3 completed shifts: In: 863.7 [I.V.:753.7; Other:110] Out: 2850 [Urine:2850]  PHYSICAL EXAMINATION: General:  FRAIL  pleasant lady Neuro: Alert and oriented, cranial nerve grossly intact, strength and sensations grossly normal bilaterally. HEENT:  PERRL, neck supple, no scleral icterus, no JVD. Cardiovascular: Tachycardia, No R/M/G. Lungs: Tachypnoic, Decreased breath sounds at bases with no wheeze but overall diminished Abdomen:  Soft, nontender, nondistended, bowel sounds positive. Musculoskeletal:  1+ LE edema B/L, no cyanosis, no obvious deformity. Skin:  Dry, warm , no rash.  LABS:  PULMONARY  Recent Labs Lab 12/06/16 1000  PHART 7.379  PCO2ART 27.5*  PO2ART 125.0*  HCO3 16.2*  TCO2 17  O2SAT 99.0    CBC  Recent Labs Lab 12/06/16 0940 12/07/16 0214  HGB 11.5* 9.7*  HCT 35.1* 28.9*  WBC 5.6 15.0*  PLT 172 201    COAGULATION No results for input(s): INR in the last 168 hours.  CARDIAC   Recent Labs Lab 12/06/16 0940 12/06/16 1228 12/06/16 1852  TROPONINI 21.62* 52.87* 59.09*  No results for input(s): PROBNP in the last 168 hours.   CHEMISTRY  Recent Labs Lab 12/06/16 0940 12/07/16 0214  NA 131* 136  K 5.0 3.5  CL 98* 101  CO2 17* 22  GLUCOSE 347* 140*  BUN 59* 60*  CREATININE 2.10* 1.84*  CALCIUM 8.3* 8.7*  MG 1.8  --   PHOS 4.0  --    Estimated Creatinine Clearance: 24.8 mL/min (by C-G formula based on SCr of 1.84 mg/dL (H)).   LIVER  Recent Labs Lab 12/06/16 0940  AST 106*  ALT 33  ALKPHOS 77  BILITOT 1.6*  PROT 6.8   ALBUMIN 3.1*     INFECTIOUS  Recent Labs Lab 12/06/16 0940 12/06/16 0941 12/07/16 0214  LATICACIDVEN  --  3.3*  --   PROCALCITON 1.27  --  1.27     ENDOCRINE CBG (last 3)   Recent Labs  12/07/16 0318 12/07/16 0909 12/07/16 1120  GLUCAP 147* 191* 146*         IMAGING x48h  - image(s) personally visualized  -   highlighted in bold Dg Chest Port 1 View  Result Date: 12/06/2016 CLINICAL DATA:  Shortness of Breath EXAM: PORTABLE CHEST 1 VIEW COMPARISON:  None. FINDINGS: There is widespread interstitial edema. There are small pleural effusions bilaterally. There is patchy airspace consolidation in the bases. There is cardiomegaly with pulmonary venous hypertension. There is aortic atherosclerosis. There is an apparent hiatal type hernia. No adenopathy is appreciable. There is degenerative change in each shoulder. IMPRESSION: Evidence of congestive heart failure. Question alveolar edema versus superimposed pneumonia in the bases. Both entities may exist concurrently. There is aortic atherosclerosis. Apparent hiatal type hernia. Electronically Signed   By: Bretta BangWilliam  Woodruff III M.D.   On: 12/06/2016 07:16       LINES/TUBES:   DISCUSSION: Tammy Fitzgerald,83 y.o lady, transferred from [facility] with complaint of worsening shortness of breath and generalized weakness. According to patient she had worsening shortness of breath going on for a few weeks, she was brought to [facility] by her daughter yesterday, where she was found to have O2 sat. In 80es, lactic acid at 6.4 ,trop. 4.1,BNP 13446, BUN 57,and Cr. 2.3. she was [facility] to [facility] for care and management.  ASSESSMENT / PLAN:  PULMONARY A: Basal pneumonia vs edema R/o underlying int lung dz   Acute Hypoxic respiratory failure,O2 saturation improved with BiPAP.   - doing well off bipap this am 1/20 P:  bipap qhs + day time prn o2 as needed levaquin   CARDIOVASCULAR A:  Mild  hypotension-history of hypertension.  - acute pulm edema due to acute systolic chf (ef 16%20%) with NSTEMI  P:  Lasix Iv heparin and rest per cards  RENAL A:   AK I/CKD (Cr.2.3, baseline unknown)    Improved creat but mild low mag _  P:   Mag repletion F/U BMET UA Avoid nephrotoxic drugs.  GASTROINTESTINAL A:   No current Issue. P:   Heparin S/C for DVT prophylaxis.  HEMATOLOGIC A:   DVT prophylaxis. P:  Heparin [facility].  INFECTIOUS A:   CAP P:   Levaquin - low threshold to dc  ENDOCRINE A:   DM   P:   CBG SSI  NEUROLOGIC A:   A & O, with some baseline dementia. P:   RASS goal: 0    FAMILY  - Updates: No family at bedside.Pt. Lives with daughter.   - Inter-disciplinary family meet or Palliative Care meeting due by:  1/26.   DISPO Move to cardiac tele, pccm off and will ask trh to assume primary; informed [facility] admissions coordinator   Dr. Kalman Shan, M.D., [facility].C.P Pulmonary and Critical Care Medicine Staff Physician [facility] Pager: [phone], If no answer or between  15:00h - 7:00h: call [phone]  12/07/2016 12:36 PM

## 2016-12-07 NOTE — Progress Notes (Signed)
Pt transferred to 2w37 from 55M. Alert and oriented with some confusion and forgetfulness. VSS, assesment completed. Pt oriented to floor and room. Call bell in reach, bed alarm on. Will continue to monitor.

## 2016-12-08 ENCOUNTER — Inpatient Hospital Stay (HOSPITAL_COMMUNITY): Payer: Medicare Other

## 2016-12-08 LAB — BASIC METABOLIC PANEL
Anion gap: 14 (ref 5–15)
BUN: 68 mg/dL — AB (ref 6–20)
CALCIUM: 8.6 mg/dL — AB (ref 8.9–10.3)
CHLORIDE: 99 mmol/L — AB (ref 101–111)
CO2: 22 mmol/L (ref 22–32)
CREATININE: 1.83 mg/dL — AB (ref 0.44–1.00)
GFR calc non Af Amer: 25 mL/min — ABNORMAL LOW (ref >60)
GFR, EST AFRICAN AMERICAN: 29 mL/min — AB (ref >60)
Glucose, Bld: 213 mg/dL — ABNORMAL HIGH (ref 65–99)
Potassium: 3.6 mmol/L (ref 3.5–5.1)
SODIUM: 135 mmol/L (ref 135–145)

## 2016-12-08 LAB — HEPARIN LEVEL (UNFRACTIONATED)
HEPARIN UNFRACTIONATED: 0.25 [IU]/mL — AB (ref 0.30–0.70)
HEPARIN UNFRACTIONATED: 0.26 [IU]/mL — AB (ref 0.30–0.70)

## 2016-12-08 LAB — CBC
HEMATOCRIT: 27.7 % — AB (ref 36.0–46.0)
HEMOGLOBIN: 9.2 g/dL — AB (ref 12.0–15.0)
MCH: 28 pg (ref 26.0–34.0)
MCHC: 33.2 g/dL (ref 30.0–36.0)
MCV: 84.5 fL (ref 78.0–100.0)
Platelets: 212 10*3/uL (ref 150–400)
RBC: 3.28 MIL/uL — AB (ref 3.87–5.11)
RDW: 15.3 % (ref 11.5–15.5)
WBC: 9.9 10*3/uL (ref 4.0–10.5)

## 2016-12-08 LAB — GLUCOSE, CAPILLARY
GLUCOSE-CAPILLARY: 194 mg/dL — AB (ref 65–99)
GLUCOSE-CAPILLARY: 332 mg/dL — AB (ref 65–99)
Glucose-Capillary: 191 mg/dL — ABNORMAL HIGH (ref 65–99)
Glucose-Capillary: 304 mg/dL — ABNORMAL HIGH (ref 65–99)

## 2016-12-08 LAB — TROPONIN I: TROPONIN I: 13.74 ng/mL — AB (ref <0.03)

## 2016-12-08 LAB — LACTIC ACID, PLASMA: Lactic Acid, Venous: 1.4 mmol/L (ref 0.5–1.9)

## 2016-12-08 LAB — MAGNESIUM: Magnesium: 2.3 mg/dL (ref 1.7–2.4)

## 2016-12-08 LAB — PHOSPHORUS: PHOSPHORUS: 4.3 mg/dL (ref 2.5–4.6)

## 2016-12-08 NOTE — Progress Notes (Signed)
ANTICOAGULATION CONSULT NOTE Pharmacy Consult for Heparin  Indication: chest pain/ACS  Allergies  Allergen Reactions  . Meperidine And Related Anaphylaxis  . Penicillins Anaphylaxis  . Aspirin Hives  . Pneumovax 23 [Pneumococcal Vac Polyvalent] Hives and Swelling   Patient Measurements: Height: 5\' 5"  (165.1 cm) Weight: 174 lb (78.9 kg) IBW/kg (Calculated) : 57  Vital Signs: Temp: 97.8 F (36.6 C) (01/21 1339) Temp Source: Oral (01/21 1339) BP: 97/54 (01/21 1339) Pulse Rate: 117 (01/21 1339)  Labs:  Recent Labs  12/06/16 0940 12/06/16 1228  12/06/16 1852 12/07/16 0214 12/07/16 2355 12/08/16 0317 12/08/16 1301  HGB 11.5*  --   --   --  9.7*  --  9.2*  --   HCT 35.1*  --   --   --  28.9*  --  27.7*  --   PLT 172  --   --   --  201  --  212  --   HEPARINUNFRC  --   --   < > 0.30 0.33  --  0.25* 0.26*  CREATININE 2.10*  --   --   --  1.84* 1.83*  --   --   TROPONINI 21.62* 52.87*  --  59.09*  --   --  13.74*  --   < > = values in this interval not displayed.  Estimated Creatinine Clearance: 25 mL/min (by C-G formula based on SCr of 1.83 mg/dL (H)).   Assessment: 83 y.o. female with Afib and NSTEMI on heparin. Heparin level this evening remains SUBtherapeutic despite rate increase earlier today. CBC stable. Planning for conservative management and heparin to continue for another 24 hours.   Goal of Therapy:  Heparin level 0.3-0.7 units/ml Monitor platelets by anticoagulation protocol: Yes   Plan:  1. Increase heparin infusion to 1250 units/hr 2. Heparin level in am  3. Daily heparin level and CBC 4. Noted plans to stop heparin in 24 hours 5. F/u long term plans for a/c for afib  Tammy Fitzgerald, PharmD, BCPS 12/08/2016, 2:50 PM

## 2016-12-08 NOTE — Progress Notes (Signed)
RT NOTE:  Pt placed on CPAP auto titrate w/ 4L O2 bled in per MD order. Pt tolerating well @ this time. RT will monitor.

## 2016-12-08 NOTE — Progress Notes (Signed)
Patient ID: Tammy Fitzgerald, female   DOB: 10-25-1933, 83 y.o.   MRN: [id number]  PROGRESS NOTE    Tammy Fitzgerald  NWG:[id number] DOB: 1933/04/01 DOA: 12/06/2016  PCP: No primary care provider on file.   Brief Narrative:  83 y.o. Female with past medical history of hypertension, diabetes (on metformin at home), dyslipidemia who was transferred from [facility] hospital to [facility] 1/19 due to worsening shortness of breath and generalized weakness for past 1-2 weeks prior to this admission. Pt also reported right sided pleuritic chest pain occasionally radiating to right shoulder aggravated by movement. She also had orthopnea, PND and lower extremity swelling. She also had cough productive of whitish sputum for past 2-3 weeks. Pt reported few episodes of diarrhea few days prior to the admission.  In [facility] she was found to have lactic acid at 6.4 ,trop. 4.1,BNP 13446, BUN 57,and Cr. 2.3. she was transferred to [facility] for further care and management.  Cardio consulted for NSTEMI but family opted for conservative care.  TRH assumed car as of 12/08/16.   Assessment & Plan:   Principal Problem:   Acute respiratory failure with hypoxia (HCC) /  Ischemic CM with severe MR and pulmonary hypertension / Acute systolic and diastolic CHF - Appreciate cardio following and recommendations  - Pt and family prefer conservative approach - Continue lasix 60 mg IV BID - Continue duoneb every 8 hours scheduled  Active Problems: Sepsis secondary to bibasilar pneumonia / Leukocytosis  - Sepsis criteria met with leukocytosis, lactic acidosis, tachypnea, tachycardia and evidence of infection on CXR - suspected bibasilar pneumonia  -  Continue Levaquin - Blood cx are negative so far  NSTEMI (non-ST elevated myocardial infarction) ([place]) - Per cardio, very robust elevation in cardiac markers - Per patient's daughter - opted for conservative approach    - Trop levels peaked and now better, trop 13.74 this  am - Has aspirin allergy. Per cardio, may consider plavix, though given atrial arrhythmias anticoagulation may be better.  BP is too low currently for beta blocker. BP 97/54 - Cardio following   Will add statin when able.  Afib with RVR, new onset  - Chads2vasc score is at least  - Will need to consider anticoagulation prior to discharge - BP too low for BB  Diabetes mellitus with peripheral circulatory complications without long term insulin use - Metformin on hold due to renal insufficiency - Currently on SSI  Acute kidney injury - Likely CKD considering degree of anemia but no previous values for comparison - Cr 1.84, 1.83  HTN, essential - Currently hypotensive  Dementia without behavioral disturbance  - Stable    DVT prophylaxis: SCD's bilaterally  Code Status: DNR/DNI Family Communication: no family at the bedside this am Disposition Plan: needs PT eval for safe discharge plan    Consultants:   Cardiology   Procedures:   ECHO 12/02/2016 - EF 25-30%, diffuse hypokinesis   Antimicrobials:   Levaquin 12/06/2016 -->   Subjective: No overnight events.   Objective: Vitals:   12/07/16 2007 12/07/16 2107 12/08/16 0500 12/08/16 0700  BP:  (!) 88/61  100/61  Pulse: (!) 104 (!) 109  94  Resp: _0 Temp:  97.8 F (36.6 C)  97.6 F (36.4 C)  TempSrc:  Oral  Oral  SpO2:  100%  100%  Weight:   78.9 kg (174 lb)   Height:        Intake/Output Summary (Last 24 hours) at 12/08/16 1203 Last data  filed at 12/08/16 0910  Gross per 24 hour  Intake           985.65 ml  Output             2150 ml  Net         -1164.35 ml   Filed Weights   12/06/16 0900 12/07/16 0500 12/08/16 0500  Weight: 80.6 kg (177 lb 11.1 oz) 78.5 kg (173 lb 1 oz) 78.9 kg (174 lb)    Examination:  General exam: Appears calm and comfortable  Respiratory system: Diminished breath sounds, no wheezing  Cardiovascular system: S1 & S2 heard, Rate controlled  Gastrointestinal system:  Abdomen is nondistended, soft and nontender. No organomegaly or masses felt. Normal bowel sounds heard. Central nervous system: Alert and oriented. No focal neurological deficits. Extremities: Symmetric 5 x 5 power. Skin: No rashes, lesions or ulcers Psychiatry: Judgement and insight appear normal. Mood & affect appropriate.   Data Reviewed: I have personally reviewed following labs and imaging studies  CBC:  Recent Labs Lab 12/06/16 0940 12/07/16 0214 12/08/16 0317  WBC 5.6 15.0* 9.9  NEUTROABS 5.0  --   --   HGB 11.5* 9.7* 9.2*  HCT 35.1* 28.9* 27.7*  MCV 85.8 84.3 84.5  PLT 172 201 283   Basic Metabolic Panel:  Recent Labs Lab 12/06/16 0940 12/07/16 0214 12/07/16 2355 12/08/16 0317  NA 131* 136 135  --   K 5.0 3.5 3.6  --   CL 98* 101 99*  --   CO2 17* 22 22  --   GLUCOSE 347* 140* 213*  --   BUN 59* 60* 68*  --   CREATININE 2.10* 1.84* 1.83*  --   CALCIUM 8.3* 8.7* 8.6*  --   MG 1.8  --   --  2.3  PHOS 4.0  --   --  4.3   GFR: Estimated Creatinine Clearance: 25 mL/min (by C-G formula based on SCr of 1.83 mg/dL (H)). Liver Function Tests:  Recent Labs Lab 12/06/16 0940  AST 106*  ALT 33  ALKPHOS 77  BILITOT 1.6*  PROT 6.8  ALBUMIN 3.1*   No results for input(s): LIPASE, AMYLASE in the last 168 hours. No results for input(s): AMMONIA in the last 168 hours. Coagulation Profile: No results for input(s): INR, PROTIME in the last 168 hours. Cardiac Enzymes:  Recent Labs Lab 12/06/16 0940 12/06/16 1228 12/06/16 1852 12/08/16 0317  TROPONINI 21.62* 52.87* 59.09* 13.74*   BNP (last 3 results) No results for input(s): PROBNP in the last 8760 hours. HbA1C: No results for input(s): HGBA1C in the last 72 hours. CBG:  Recent Labs Lab 12/07/16 1120 12/07/16 1635 12/07/16 2104 12/08/16 0712 12/08/16 1119  GLUCAP 146* 259* 204* 194* 332*   Lipid Profile: No results for input(s): CHOL, HDL, LDLCALC, TRIG, CHOLHDL, LDLDIRECT in the last 72  hours. Thyroid Function Tests: No results for input(s): TSH, T4TOTAL, FREET4, T3FREE, THYROIDAB in the last 72 hours. Anemia Panel: No results for input(s): VITAMINB12, FOLATE, FERRITIN, TIBC, IRON, RETICCTPCT in the last 72 hours. Urine analysis:    Component Value Date/Time   COLORURINE YELLOW 12/06/2016 1015   APPEARANCEUR HAZY (A) 12/06/2016 1015   LABSPEC 1.020 12/06/2016 1015   PHURINE 5.0 12/06/2016 1015   GLUCOSEU NEGATIVE 12/06/2016 1015   HGBUR NEGATIVE 12/06/2016 1015   BILIRUBINUR NEGATIVE 12/06/2016 [place] 12/06/2016 1015   PROTEINUR 30 (A) 12/06/2016 1015   NITRITE NEGATIVE 12/06/2016 1015   LEUKOCYTESUR NEGATIVE 12/06/2016 1015  Sepsis Labs: _0 (procalcitonin:4,lacticidven:4)   MRSA PCR Screening     Status: None   Collection Time: 12/06/16  5:43 AM  Result Value Ref Range Status   MRSA by PCR NEGATIVE NEGATIVE Final  Culture, blood (routine x 2)     Status: None (Preliminary result)   Collection Time: 12/06/16 10:20 AM  Result Value Ref Range Status   Specimen Description BLOOD RIGHT HAND  Final   Culture NO GROWTH < 24 HOURS  Final   Report Status PENDING  Incomplete  Culture, blood (routine x 2)     Status: None (Preliminary result)   Collection Time: 12/06/16 10:20 AM  Result Value Ref Range Status   Specimen Description BLOOD RIGHT HAND  Final   Special Requests IN PEDIATRIC BOTTLE 1.5CC  Final   Culture NO GROWTH < 24 HOURS  Final   Report Status PENDING  Incomplete      Radiology Studies: Dg Chest Port 1 View Result Date: 12/08/2016 1. Probable asymmetric edema, improved in the interval. 2. The right pleural effusion and underlying opacity are more prominent/larger in the interval. The smaller left effusion and underlying opacity are stable.   Dg Chest Port 1 View Result Date: 12/06/2016 Evidence of congestive heart failure. Question alveolar edema versus superimposed pneumonia in the bases. Both entities may exist  concurrently. There is aortic atherosclerosis. Apparent hiatal type hernia.    Scheduled Meds: . furosemide  60 mg Intravenous BID  . insulin aspart  0-9 Units Subcutaneous TID WC  . ipratropium-albuter  3 mL Nebulization TID  . levofloxacin (LEVAQUIN) IV  500 mg Intravenous Q48H   Continuous Infusions: . sodium chloride 10 mL/hr at 12/07/16 1245  . heparin 1,050 Units/hr (12/08/16 0457)     LOS: 2 days    Time spent: 25 minutes  Greater than 50% of the time spent on counseling and coordinating the care.   Leisa Lenz, MD [facility] Pager [phone]  If 7PM-7AM, please contact night-coverage [email or web address] Password [facility] 12/08/2016, 12:03 PM

## 2016-12-08 NOTE — Progress Notes (Signed)
ANTICOAGULATION CONSULT NOTE Pharmacy Consult for Heparin  Indication: chest pain/ACS  Allergies  Allergen Reactions  . Meperidine And Related Anaphylaxis  . Penicillins Anaphylaxis  . Aspirin Hives  . Pneumovax 23 [Pneumococcal Vac Polyvalent] Hives and Swelling   Patient Measurements: Height: $RemoveBefor [id number]$5\' 5"Calculated) : 57  Vital Signs: Temp: 97.8 F (36.6 C) (01/20 2107) Temp Source: Oral (01/20 2107) BP: 88/61 (01/20 2107) Pulse Rate: 109 (01/20 2107)  Labs:  Recent Labs  12/06/16 0940 12/06/16 1228 12/06/16 1852 12/07/16 0214 12/07/16 2355 12/08/16 0317  HGB 11.5*  --   --  9.7*  --  9.2*  HCT 35.1*  --   --  28.9*  --  27.7*  PLT 172  --   --  201  --  212  HEPARINUNFRC  --   --  0.30 0.33  --  0.25*  CREATININE 2.10*  --   --  1.84* 1.83*  --   TROPONINI 21.62* 52.87* 59.09*  --   --  13.74*    Estimated Creatinine Clearance: 25 mL/min (by C-G formula based on SCr of 1.83 mg/dL (H)).   Assessment: 83 y.o. female with Afib for heparin  Goal of Therapy:  Heparin level 0.3-0.7 units/ml Monitor platelets by anticoagulation protocol: Yes   Plan:  Increase Heparin 1050 units/hr  Konstantin Lehnen, Gary FleetGregory Vernon 12/08/2016,4:52 AM

## 2016-12-08 NOTE — Progress Notes (Signed)
SUBJECTIVE: The patient remains quite ill.  She reports SOB with movement.  She has converted back to sinus rhythm.     . chlorhexidine  15 mL Mouth Rinse BID  . furosemide  60 mg Intravenous BID  . insulin aspart  0-9 Units Subcutaneous TID WC  . ipratropium-albuterol  3 mL Nebulization TID  . levofloxacin (LEVAQUIN) IV  500 mg Intravenous Q48H  . mouth rinse  15 mL Mouth Rinse q12n4p   . sodium chloride 10 mL/hr at 12/07/16 1245  . heparin 1,050 Units/hr (12/08/16 0457)    OBJECTIVE: Physical Exam: Vitals:   12/07/16 2007 12/07/16 2107 12/08/16 0500 12/08/16 0700  BP:  (!) 88/61  100/61  Pulse: (!) 104 (!) 109  94  Resp: 18 18  18   Temp:  97.8 F (36.6 C)  97.6 F (36.4 C)  TempSrc:  Oral  Oral  SpO2:  100%  100%  Weight:   174 lb (78.9 kg)   Height:        Intake/Output Summary (Last 24 hours) at 12/08/16 1047 Last data filed at 12/08/16 0910  Gross per 24 hour  Intake          1104.65 ml  Output             2150 ml  Net         -1045.35 ml    Telemetry reveals that afib has returned to sinus rhythm  GEN- The patient is elderly appearing, alert, pleasant  Head- normocephalic, atraumatic Eyes-  Sclera clear, conjunctiva pink Ears- hearing intact Oropharynx- clear Neck- supple, + JVD Lungs- decreased BS at bases, normal work of breathing Heart- RRR GI- soft, NT, ND, + BS Extremities- no clubbing, cyanosis, +1 edema Skin- no rash or lesion Psych- euthymic mood, full affect Neuro- strength and sensation are intact Frail and ill appearing  LABS: Basic Metabolic Panel:  Recent Labs  40/98/1101/19/18 0940 12/07/16 0214 12/07/16 2355 12/08/16 0317  NA 131* 136 135  --   K 5.0 3.5 3.6  --   CL 98* 101 99*  --   CO2 17* 22 22  --   GLUCOSE 347* 140* 213*  --   BUN 59* 60* 68*  --   CREATININE 2.10* 1.84* 1.83*  --   CALCIUM 8.3* 8.7* 8.6*  --   MG 1.8  --   --  2.3  PHOS 4.0  --   --  4.3   Liver Function Tests:  Recent Labs  12/06/16 0940  AST  106*  ALT 33  ALKPHOS 77  BILITOT 1.6*  PROT 6.8  ALBUMIN 3.1*   No results for input(s): LIPASE, AMYLASE in the last 72 hours. CBC:  Recent Labs  12/06/16 0940 12/07/16 0214 12/08/16 0317  WBC 5.6 15.0* 9.9  NEUTROABS 5.0  --   --   HGB 11.5* 9.7* 9.2*  HCT 35.1* 28.9* 27.7*  MCV 85.8 84.3 84.5  PLT 172 201 212   Cardiac Enzymes:  Recent Labs  12/06/16 1228 12/06/16 1852 12/08/16 0317  TROPONINI 52.87* 59.09* 13.74*   Echo is reviewed- EF 25% with diffuse HK, severe MR, severe pulm HTN  ASSESSMENT AND PLAN:   1. NSTEMI Very robust elevation in cardiac markers.  Dr Delton SeeNelson had a long conversation with patient's daughter and a conservative approach was decided upon.  Tns have peakied and are now improving.  Will continue IV heparin for another 24 hours.  She has an ASA allergy. May consider plavix, though  given atrial arrhythmias anticoagulation may be better.  BP is too low currently for beta blocker.  Will add statin when able.  2. afib with RVR New onset Now back in sinus Continue on IV heparin for another 24 hours chads2vasc score is at least 7.  Will need to consider anticoagulation prior to discharge  3.  HTN Currently hypotensive  4. Dementia Stable  5. Ischemic CM with severe MR and pulmonary hypertension Supportive care at this time  Her prognosis is very poor.  She is appropriately DNR. If her condition declines further, would strongly consider palliative options  General cardiology to follow  Hillis Range, MD 12/08/2016 10:47 AM

## 2016-12-09 DIAGNOSIS — I5021 Acute systolic (congestive) heart failure: Secondary | ICD-10-CM

## 2016-12-09 DIAGNOSIS — J189 Pneumonia, unspecified organism: Secondary | ICD-10-CM

## 2016-12-09 LAB — CBC
HEMATOCRIT: 31 % — AB (ref 36.0–46.0)
HEMOGLOBIN: 10.1 g/dL — AB (ref 12.0–15.0)
MCH: 27.7 pg (ref 26.0–34.0)
MCHC: 32.6 g/dL (ref 30.0–36.0)
MCV: 84.9 fL (ref 78.0–100.0)
Platelets: 261 10*3/uL (ref 150–400)
RBC: 3.65 MIL/uL — AB (ref 3.87–5.11)
RDW: 15.2 % (ref 11.5–15.5)
WBC: 9.3 10*3/uL (ref 4.0–10.5)

## 2016-12-09 LAB — GLUCOSE, CAPILLARY
GLUCOSE-CAPILLARY: 243 mg/dL — AB (ref 65–99)
GLUCOSE-CAPILLARY: 321 mg/dL — AB (ref 65–99)
Glucose-Capillary: 280 mg/dL — ABNORMAL HIGH (ref 65–99)
Glucose-Capillary: 280 mg/dL — ABNORMAL HIGH (ref 65–99)

## 2016-12-09 LAB — BASIC METABOLIC PANEL
ANION GAP: 14 (ref 5–15)
BUN: 57 mg/dL — AB (ref 6–20)
CO2: 26 mmol/L (ref 22–32)
Calcium: 9 mg/dL (ref 8.9–10.3)
Chloride: 96 mmol/L — ABNORMAL LOW (ref 101–111)
Creatinine, Ser: 1.51 mg/dL — ABNORMAL HIGH (ref 0.44–1.00)
GFR calc Af Amer: 36 mL/min — ABNORMAL LOW (ref >60)
GFR, EST NON AFRICAN AMERICAN: 31 mL/min — AB (ref >60)
Glucose, Bld: 291 mg/dL — ABNORMAL HIGH (ref 65–99)
POTASSIUM: 3.8 mmol/L (ref 3.5–5.1)
SODIUM: 136 mmol/L (ref 135–145)

## 2016-12-09 LAB — HEPARIN LEVEL (UNFRACTIONATED): HEPARIN UNFRACTIONATED: 0.49 [IU]/mL (ref 0.30–0.70)

## 2016-12-09 LAB — MAGNESIUM: Magnesium: 1.9 mg/dL (ref 1.7–2.4)

## 2016-12-09 LAB — PHOSPHORUS: PHOSPHORUS: 3.9 mg/dL (ref 2.5–4.6)

## 2016-12-09 MED ORDER — INSULIN ASPART 100 UNIT/ML ~~LOC~~ SOLN
5.0000 [IU] | Freq: Once | SUBCUTANEOUS | Status: AC
  Administered 2016-12-09: 5 [IU] via SUBCUTANEOUS

## 2016-12-09 MED ORDER — ATORVASTATIN CALCIUM 40 MG PO TABS
40.0000 mg | ORAL_TABLET | Freq: Every day | ORAL | Status: DC
Start: 2016-12-09 — End: 2016-12-11
  Administered 2016-12-09 – 2016-12-10 (×2): 40 mg via ORAL
  Filled 2016-12-09 (×2): qty 1

## 2016-12-09 MED ORDER — APIXABAN 2.5 MG PO TABS
2.5000 mg | ORAL_TABLET | Freq: Two times a day (BID) | ORAL | Status: DC
  Administered 2016-12-09 – 2016-12-11 (×5): 2.5 mg via ORAL
  Filled 2016-12-09 (×5): qty 1

## 2016-12-09 NOTE — Progress Notes (Addendum)
Patient ID: Tammy Fitzgerald, female   DOB: 12-28-1932, 83 y.o.   MRN: [id number]  PROGRESS NOTE    Tammy Fitzgerald  LID:[id number] DOB: 09-17-1933 DOA: 12/06/2016  PCP: No primary care provider on file.   Brief Narrative:  83 y.o. Female with past medical history of hypertension, diabetes (on metformin at home), dyslipidemia who was transferred from [facility] hospital to [facility] 1/19 due to worsening shortness of breath and generalized weakness for past 1-2 weeks prior to this admission. Pt also reported right sided pleuritic chest pain occasionally radiating to right shoulder aggravated by movement. She also had orthopnea, PND and lower extremity swelling. She also had cough productive of whitish sputum for past 2-3 weeks. Pt reported few episodes of diarrhea few days prior to the admission.  In [facility] she was found to have lactic acid at 6.4 ,trop. 4.1,BNP 13446, BUN 57,and Cr. 2.3. she was transferred to [facility] for further care and management.  Cardio consulted for NSTEMI but family opted for conservative care.  TRH assumed car as of 12/08/16.   Assessment & Plan:   Principal Problem:   Acute respiratory failure with hypoxia (HCC) /  Ischemic CM with severe MR and pulmonary hypertension / Acute systolic and diastolic CHF - Appreciate cardio following and recommendations  - Pt and family prefer conservative approach - Continue lasix 60 mg IV BID - Continue duoneb every 8 hours scheduled - Stable respiratory status   Active Problems: Sepsis secondary to bibasilar pneumonia / Leukocytosis  - Sepsis criteria met with leukocytosis, lactic acidosis, tachypnea, tachycardia and evidence of infection on CXR - suspected bibasilar pneumonia  - Continue Levaquin Q 48 hours  - Blood cx showed no growth   NSTEMI (non-ST elevated myocardial infarction) (HCC) - Robust elevation in cardiac markers - Per patient's daughter - opted for conservative approach    - Trop levels peaked and now  better, trop 13.74 this am - Has aspirin allergy - Cardio started apixaban 2/5 mg bID and heparin drip stopped today 1/22 - Added Lipitor 40 mg at bedtime today 1/22  Afib with RVR, new onset  - Chads2vasc score is at least 3 - Started apixaban today   Diabetes mellitus with peripheral circulatory complications without long term insulin use - Metformin on hold due to renal insufficiency - Continue SSI in hospital  Acute kidney injury - Likely CKD considering degree of anemia but no previous values for comparison - Cr 1.84, 1.83  HTN, essential - Continue lasix   Dementia without behavioral disturbance  - Stable    DVT prophylaxis: SCD's bilaterally  Code Status: DNR/DNI Family Communication: no family at the bedside this am; called pt son at [phone], no option to leave VM Disposition Plan: to SNF once bed available    Consultants:   Cardiology   Procedures:   ECHO 12/02/2016 - EF 25-30%, diffuse hypokinesis   Antimicrobials:   Levaquin 12/06/2016 -->   Subjective: No overnight events.   Objective: Vitals:   12/08/16 2106 12/08/16 2202 12/09/16 0557 12/09/16 0900  BP: (!) 91/59  (!) 94/59   Pulse: (!) 113 (!) 108    Resp: _0 Temp: 97.6 F (36.4 C)  97.8 F (36.6 C)   TempSrc: Oral  Oral   SpO2: 100% 98% 100% 100%  Weight:   77.1 kg (170 lb)   Height:        Intake/Output Summary (Last 24 hours) at 12/09/16 1312 Last data filed at 12/09/16 2820  Gross  per 24 hour  Intake                0 ml  Output             2660 ml  Net            -2660 ml   Filed Weights   12/07/16 0500 12/08/16 0500 12/09/16 0557  Weight: 78.5 kg (173 lb 1 oz) 78.9 kg (174 lb) 77.1 kg (170 lb)    Examination:  General exam: Appears calm and comfortable, no distress  Respiratory system: Diminished breath sounds, no wheezing  Cardiovascular system: S1 & S2 (+), Rate controlled  Gastrointestinal system: (+) BS, non tender abd Central nervous system: Alert and  oriented. No focal neurological deficits. Extremities: No edema, palpable pulses  Skin: skin is warm and dry  Psychiatry: Mood & affect appropriate.   Data Reviewed: I have personally reviewed following labs and imaging studies  CBC:  Recent Labs Lab 12/06/16 0940 12/07/16 0214 12/08/16 0317 12/09/16 0331  WBC 5.6 15.0* 9.9 9.3  NEUTROABS 5.0  --   --   --   HGB 11.5* 9.7* 9.2* 10.1*  HCT 35.1* 28.9* 27.7* 31.0*  MCV 85.8 84.3 84.5 84.9  PLT 172 201 212 802   Basic Metabolic Panel:  Recent Labs Lab 12/06/16 0940 12/07/16 0214 12/07/16 2355 12/08/16 0317 12/09/16 0331  NA 131* 136 135  --  136  K 5.0 3.5 3.6  --  3.8  CL 98* 101 99*  --  96*  CO2 17* 22 22  --  26  GLUCOSE 347* 140* 213*  --  291*  BUN 59* 60* 68*  --  57*  CREATININE 2.10* 1.84* 1.83*  --  1.51*  CALCIUM 8.3* 8.7* 8.6*  --  9.0  MG 1.8  --   --  2.3 1.9  PHOS 4.0  --   --  4.3 3.9   GFR: Estimated Creatinine Clearance: 30 mL/min (by C-G formula based on SCr of 1.51 mg/dL (H)). Liver Function Tests:  Recent Labs Lab 12/06/16 0940  AST 106*  ALT 33  ALKPHOS 77  BILITOT 1.6*  PROT 6.8  ALBUMIN 3.1*   No results for input(s): LIPASE, AMYLASE in the last 168 hours. No results for input(s): AMMONIA in the last 168 hours. Coagulation Profile: No results for input(s): INR, PROTIME in the last 168 hours. Cardiac Enzymes:  Recent Labs Lab 12/06/16 0940 12/06/16 1228 12/06/16 1852 12/08/16 0317  TROPONINI 21.62* 52.87* 59.09* 13.74*   BNP (last 3 results) No results for input(s): PROBNP in the last 8760 hours. HbA1C: No results for input(s): HGBA1C in the last 72 hours. CBG:  Recent Labs Lab 12/08/16 1119 12/08/16 1624 12/08/16 2103 12/09/16 0552 12/09/16 1126  GLUCAP 332* 191* 304* 280* 280*   Lipid Profile: No results for input(s): CHOL, HDL, LDLCALC, TRIG, CHOLHDL, LDLDIRECT in the last 72 hours. Thyroid Function Tests: No results for input(s): TSH, T4TOTAL, FREET4,  T3FREE, THYROIDAB in the last 72 hours. Anemia Panel: No results for input(s): VITAMINB12, FOLATE, FERRITIN, TIBC, IRON, RETICCTPCT in the last 72 hours. Urine analysis:    Component Value Date/Time   COLORURINE YELLOW 12/06/2016 1015   APPEARANCEUR HAZY (A) 12/06/2016 1015   LABSPEC 1.020 12/06/2016 1015   PHURINE 5.0 12/06/2016 1015   GLUCOSEU NEGATIVE 12/06/2016 1015   HGBUR NEGATIVE 12/06/2016 1015   BILIRUBINUR NEGATIVE 12/06/2016 [place] 12/06/2016 1015   PROTEINUR 30 (A) 12/06/2016 1015  NITRITE NEGATIVE 12/06/2016 [place] 12/06/2016 1015   Sepsis Labs: _0 (procalcitonin:4,lacticidven:4)   MRSA PCR Screening     Status: None   Collection Time: 12/06/16  5:43 AM  Result Value Ref Range Status   MRSA by PCR NEGATIVE NEGATIVE Final  Culture, blood (routine x 2)     Status: None (Preliminary result)   Collection Time: 12/06/16 10:20 AM  Result Value Ref Range Status   Specimen Description BLOOD RIGHT HAND  Final   Culture NO GROWTH < 24 HOURS  Final   Report Status PENDING  Incomplete  Culture, blood (routine x 2)     Status: None (Preliminary result)   Collection Time: 12/06/16 10:20 AM  Result Value Ref Range Status   Specimen Description BLOOD RIGHT HAND  Final   Special Requests IN PEDIATRIC BOTTLE 1.5CC  Final   Culture NO GROWTH < 24 HOURS  Final   Report Status PENDING  Incomplete      Radiology Studies: Dg Chest Port 1 View Result Date: 12/08/2016 1. Probable asymmetric edema, improved in the interval. 2. The right pleural effusion and underlying opacity are more prominent/larger in the interval. The smaller left effusion and underlying opacity are stable.   Dg Chest Port 1 View Result Date: 12/06/2016 Evidence of congestive heart failure. Question alveolar edema versus superimposed pneumonia in the bases. Both entities may exist concurrently. There is aortic atherosclerosis. Apparent hiatal type hernia.     Scheduled Meds: . furosemide  60 mg Intravenous BID  . insulin aspart  0-9 Units Subcutaneous TID WC  . ipratropium-albuter  3 mL Nebulization TID  . levofloxacin (LEVAQUIN) IV  500 mg Intravenous Q48H   Continuous Infusions: . sodium chloride 10 mL/hr at 12/07/16 1245     LOS: 3 days    Time spent: 25 minutes  Greater than 50% of the time spent on counseling and coordinating the care.   Leisa Lenz, MD [facility] Pager [phone]  If 7PM-7AM, please contact night-coverage [email or web address] Password [email or web address] 12/09/2016, 1:12 PM

## 2016-12-09 NOTE — Progress Notes (Signed)
Inpatient Diabetes Program Recommendations  AACE/ADA: New Consensus Statement on Inpatient Glycemic Control (2015)  Target Ranges:  Prepandial:   less than 140 mg/dL      Peak postprandial:   less than 180 mg/dL (1-2 hours)      Critically ill patients:  140 - 180 mg/dL   Lab Results  Component Value Date   GLUCAP 280 (H) 12/09/2016    Review of Glycemic Control Results for Tammy Fitzgerald, [name] (MRN [id number]) as of 12/09/2016 11:07  Ref. Range 12/08/2016 07:12 12/08/2016 11:19 12/08/2016 16:24 12/08/2016 21:03 12/09/2016 05:52  Glucose-Capillary Latest Ref Range: 65 - 99 mg/dL 409194 (H) 811332 (H) 914191 (H) 304 (H) 280 (H)   Diabetes history: DM2 Outpatient Diabetes medications: Metformin 1 gm bid Current orders for Inpatient glycemic control: Novolog correction 0-9 units tid  Inpatient Diabetes Program Recommendations:  Noted hyperglycemia. Please consider: -Increase Novolog correction to 0-15 units tid + 0-5 units hs  Thank you, Billy FischerJudy E. Edu On, RN, MSN, CDE Inpatient Glycemic Control Team Team Pager [phone] (8am-5pm) 12/09/2016 11:14 AM

## 2016-12-09 NOTE — Evaluation (Signed)
Physical Therapy Evaluation Patient Details Name: Tammy CowboyDixie Due MRN: [id number] DOB: May 20, 1933 Today's Date: 12/09/2016   History of Present Illness  83 y.o. female admitted for acute respiratory failure and NSTEMI. After admission, pt had new onset a fib. She has now converted back to normal sinus rhythm. PMH consists of HTN and DM.   Clinical Impression  Pt admitted with above diagnosis. Pt currently with functional limitations due to the deficits listed below (see PT Problem List). On eval, pt required mod assist bed mobility and transfers. Unable to progress ambulation due to weakness and tachycardia. DOE noted with minimal mobility.  Pt will benefit from skilled PT to increase their independence and safety with mobility to allow discharge to the venue listed below.       Follow Up Recommendations SNF    Equipment Recommendations  None recommended by PT    Recommendations for Other Services       Precautions / Restrictions Precautions Precautions: Fall Restrictions Weight Bearing Restrictions: No      Mobility  Bed Mobility Overal bed mobility: Needs Assistance Bed Mobility: Supine to Sit     Supine to sit: Mod assist;HOB elevated     General bed mobility comments: +rail, verbal cues for sequencing, use of bed pad to scoot to EOB  Transfers Overall transfer level: Needs assistance Equipment used: None Transfers: Sit to/from UGI CorporationStand;Stand Pivot Transfers Sit to Stand: Mod assist;From elevated surface Stand pivot transfers: Mod assist       General transfer comment: tachy (142) during transfer, Pt able to take pivot steps bed to recliner.  Ambulation/Gait             General Gait Details: Unable to progress due to weakness and tachycardia. Pt will need RW for ambulation.  Stairs            Wheelchair Mobility    Modified Rankin (Stroke Patients Only)       Balance Overall balance assessment: Needs assistance Sitting-balance support: Feet  supported;No upper extremity supported Sitting balance-Leahy Scale: Good     Standing balance support: Bilateral upper extremity supported;During functional activity Standing balance-Leahy Scale: Poor                               Pertinent Vitals/Pain Pain Assessment: No/denies pain    Home Living Family/patient expects to be discharged to:: Private residence Living Arrangements: Children Available Help at Discharge: Family;Available 24 hours/day Type of Home: Mobile home Home Access: Stairs to enter     Home Layout: One level Home Equipment: Environmental consultantWalker - 2 wheels;Cane - single point;Shower seat;Bedside commode;Grab bars - tub/shower;Grab bars - toilet      Prior Function Level of Independence: Independent with assistive device(s)         Comments: Independent with ADLs. Pt used RW or SPC for ambulation. Limited to mainly household ambulation.     Hand Dominance        Extremity/Trunk Assessment   Upper Extremity Assessment Upper Extremity Assessment: Generalized weakness    Lower Extremity Assessment Lower Extremity Assessment: Generalized weakness    Cervical / Trunk Assessment Cervical / Trunk Assessment: Kyphotic  Communication   Communication: No difficulties  Cognition Arousal/Alertness: Awake/alert Behavior During Therapy: WFL for tasks assessed/performed Overall Cognitive Status: Within Functional Limits for tasks assessed                      General Comments  Exercises     Assessment/Plan    PT Assessment Patient needs continued PT services  PT Problem List Decreased strength;Decreased activity tolerance;Decreased balance;Decreased mobility;Cardiopulmonary status limiting activity          PT Treatment Interventions Gait training;Functional mobility training;Balance training;Stair training;Therapeutic exercise;Therapeutic activities;Patient/family education    PT Goals (Current goals can be found in the Care Plan  section)  Acute Rehab PT Goals Patient Stated Goal: to get stronger PT Goal Formulation: With patient Time For Goal Achievement: 12/23/16 Potential to Achieve Goals: Fair    Frequency Min 3X/week   Barriers to discharge        Co-evaluation               End of Session Equipment Utilized During Treatment: Gait belt Activity Tolerance: Patient limited by fatigue;Treatment limited secondary to medical complications (Comment) (tachy) Patient left: in chair;with call bell/phone within reach;with family/visitor present Nurse Communication: Mobility status         Time: 4098-1191 PT Time Calculation (min) (ACUTE ONLY): 28 min   Charges:   PT Evaluation $PT Eval Moderate Complexity: 1 Procedure PT Treatments $Therapeutic Activity: 8-22 mins   PT G Codes:        Ilda Foil 12/09/2016, 12:26 PM

## 2016-12-09 NOTE — Progress Notes (Signed)
Progress Note  Patient Name: Tammy Fitzgerald Date of Encounter: 12/09/2016  Primary Cardiologist: Dr. Delton See (New)  Subjective   No new complaints, no CP. SOB with activity. Smiles. Wants to go home.   Inpatient Medications    Scheduled Meds: . chlorhexidine  15 mL Mouth Rinse BID  . furosemide  60 mg Intravenous BID  . insulin aspart  0-9 Units Subcutaneous TID WC  . ipratropium-albuterol  3 mL Nebulization TID  . levofloxacin (LEVAQUIN) IV  500 mg Intravenous Q48H  . mouth rinse  15 mL Mouth Rinse q12n4p   Continuous Infusions: . sodium chloride 10 mL/hr at 12/07/16 1245  . heparin 1,250 Units/hr (12/09/16 0647)   PRN Meds: sodium chloride, ipratropium-albuterol   Vital Signs    Vitals:   12/08/16 1339 12/08/16 2106 12/08/16 2202 12/09/16 0557  BP: (!) 97/54 (!) 91/59  (!) 94/59  Pulse: (!) 117 (!) 113 (!) 108   Resp: 19 19 18 18   Temp: 97.8 F (36.6 C) 97.6 F (36.4 C)  97.8 F (36.6 C)  TempSrc: Oral Oral  Oral  SpO2: 94% 100% 98% 100%  Weight:    170 lb (77.1 kg)  Height:        Intake/Output Summary (Last 24 hours) at 12/09/16 0830 Last data filed at 12/09/16 0655  Gross per 24 hour  Intake              240 ml  Output             3860 ml  Net            -3620 ml   Filed Weights   12/07/16 0500 12/08/16 0500 12/09/16 0557  Weight: 173 lb 1 oz (78.5 kg) 174 lb (78.9 kg) 170 lb (77.1 kg)    Telemetry    NSR (prior AFIB) - Personally Reviewed  ECG    NSR ST depression diffuse, aVR elevation - Personally Reviewed  Physical Exam   GEN: No acute distress, elderly Neck: No JVD Cardiac: RRR, no murmurs, rubs, or gallops.  Respiratory: Decreased BS at bases to auscultation bilaterally. Increased resp rate. GI: Soft, nontender, non-distended  MS:  1+ edema; No deformity. Neuro:  Alert. Psych: Conversant  Labs    Chemistry Recent Labs Lab 12/06/16 0940 12/07/16 0214 12/07/16 2355 12/09/16 0331  NA 131* 136 135 136  K 5.0 3.5 3.6 3.8  CL  98* 101 99* 96*  CO2 17* 22 22 26   GLUCOSE 347* 140* 213* 291*  BUN 59* 60* 68* 57*  CREATININE 2.10* 1.84* 1.83* 1.51*  CALCIUM 8.3* 8.7* 8.6* 9.0  PROT 6.8  --   --   --   ALBUMIN 3.1*  --   --   --   AST 106*  --   --   --   ALT 33  --   --   --   ALKPHOS 77  --   --   --   BILITOT 1.6*  --   --   --   GFRNONAA 21* 25* 25* 31*  GFRAA 24* 29* 29* 36*  ANIONGAP 16* 13 14 14      Hematology Recent Labs Lab 12/07/16 0214 12/08/16 0317 12/09/16 0331  WBC 15.0* 9.9 9.3  RBC 3.43* 3.28* 3.65*  HGB 9.7* 9.2* 10.1*  HCT 28.9* 27.7* 31.0*  MCV 84.3 84.5 84.9  MCH 28.3 28.0 27.7  MCHC 33.6 33.2 32.6  RDW 15.5 15.3 15.2  PLT 201 212 261    Cardiac  Enzymes Recent Labs Lab 12/06/16 0940 12/06/16 1228 12/06/16 1852 12/08/16 0317  TROPONINI 21.62* 52.87* 59.09* 13.74*   No results for input(s): TROPIPOC in the last 168 hours.   BNP Recent Labs Lab 12/06/16 0800  BNP 1,681.8*     DDimer No results for input(s): DDIMER in the last 168 hours.   Radiology    Dg Chest Port 1 View  Result Date: 12/08/2016 CLINICAL DATA:  Acute on chronic respiratory failure EXAM: PORTABLE CHEST 1 VIEW COMPARISON:  December 06, 2016 FINDINGS: No pneumothorax. Bilateral interstitial opacities, right greater than left, persist but are improved. Bilateral pleural effusions with underlying atelectasis are stable on the left and more prominent on the right in the interval. No other interval change. IMPRESSION: 1. Probable asymmetric edema, improved in the interval. 2. The right pleural effusion and underlying opacity are more prominent/larger in the interval. The smaller left effusion and underlying opacity are stable. Electronically Signed   By: Gerome Samavid  Williams III M.D   On: 12/08/2016 09:33    Cardiac Studies   - Left ventricle: The cavity size was normal. Wall thickness was   increased in a pattern of mild LVH. Systolic function was   severely reduced. The estimated ejection fraction was in  the   range of 25% to 30%. Diffuse hypokinesis. There was no evidence   of elevated ventricular filling pressure by Doppler parameters. - Aortic valve: Cusp separation was reduced. - Mitral valve: Calcified annulus. Mildly thickened leaflets .   There was severe regurgitation directed centrally. - Pulmonary arteries: Systolic pressure was moderately to severely   increased. PA peak pressure: 67 mm Hg (S). - Pericardium, extracardiac: A trivial pericardial effusion was   identified.  Patient Profile     83 y.o. female with NSTEMI Trop peak 60 with EF 25%, ischemic cardiomyopathy, ECG suggestive of 3v disease, acute systolic heart failure  Assessment & Plan    NSTEMI  - non invasive management, family discussion, no Cath  - ASA allergy, will add statin atorvastatin 40, no Bb due to hypotension.   - Will stop heparin IV (trop trending down, 48hrs)  PAF/atrial flutter  - seen again last eve with HR 141, 2:1 conduction  - recommend Eliquis 2.5 BID (Greater than 80 with Creat >1.5). Daughter OK with this. She is familiar with this medication  Dementia  - no change. Daughter states she has good days and bad.   Acute systolic heart failure with ischemic CM/severe MR  - medical supportive care  - IV lasix continue, foley  - watch renal function--improving  DNR  - poor prognosis.   Weakness  - PT/ cardiac rehab. May very well need SNF. Daughter is concerned about being able to take care of her at home.   Signed, Donato SchultzMark Tyanne Derocher, MD  12/09/2016, 8:30 AM

## 2016-12-09 NOTE — Procedures (Signed)
Placed patient on BIPAP auto for the night.  Patient is tolerating well at this time. 

## 2016-12-09 NOTE — Progress Notes (Signed)
1303 Read PT's eval. Will follow their progress with pt. Luetta NuttingCharlene Ferrell Claiborne RN BSN 12/09/2016 1:03 PM

## 2016-12-09 NOTE — Progress Notes (Signed)
RT NOTE:  RN called to inform RT of patient complaining on discomfort from CPAP mask. Pt has not worn CPAP @ home in the past. RT placed CPAP per MD mandatory order. [facility]

## 2016-12-09 NOTE — NC FL2 (Signed)
[place] MEDICAID FL2 LEVEL OF CARE SCREENING TOOL     IDENTIFICATION  Patient Name: Tammy CowboyDixie Begnaud Birthdate: February 20, 1933 Sex: female Admission Date (Current Location): 12/06/2016  [facility] [place]   Facility and Address:  [facility] [place], [address], [place], [place] [address]      Provider Number: [id number]  Attending Physician Name and Address:  Alison MurrayAlma M Devine, MD  Relative Name and Phone Number:       Current Level of Care: Hospital Recommended Level of Care: Skilled Nursing Facility Prior Approval Number:    Date Approved/Denied:   PASRR Number:   [id number]   Discharge Plan: SNF    Current Diagnoses: Patient Active Problem List   Diagnosis Date Noted  . New onset atrial fibrillation (HCC)   . Pneumonia 12/06/2016  . NSTEMI (non-ST elevated myocardial infarction) (HCC) 12/06/2016  . Essential hypertension 12/06/2016  . Acute respiratory failure (HCC) 12/06/2016    Orientation RESPIRATION BLADDER Height & Weight     Self, Place  O2 (4L) Continent Weight: 170 lb (77.1 kg) Height:  5\' 5"  (165.1 cm)  BEHAVIORAL SYMPTOMS/MOOD NEUROLOGICAL BOWEL NUTRITION STATUS      Continent Diet (See DC summary)  AMBULATORY STATUS COMMUNICATION OF NEEDS Skin   Extensive Assist Verbally Normal                       Personal Care Assistance Level of Assistance  Bathing, Feeding, Dressing Bathing Assistance: Limited assistance Feeding assistance: Limited assistance Dressing Assistance: Limited assistance     Functional Limitations Info  Sight, Hearing, Speech Sight Info: Adequate Hearing Info: Adequate Speech Info: Adequate    SPECIAL CARE FACTORS FREQUENCY  PT (By licensed PT), OT (By licensed OT)     PT Frequency: 5x OT Frequency: 5x            Contractures Contractures Info: Not present    Additional Factors Info  Code Status, Allergies Code Status Info: DNR Allergies Info:  Meperidine And Related,  Penicillins, Aspirin, Pneumovax 23 Pneumococcal Vac Polyvalent           Current Medications (12/09/2016):  This is the current hospital active medication list Current Facility-Administered Medications  Medication Dose Route Frequency Provider Last Rate Last Dose  . 0.9 %  sodium chloride infusion  250 mL Intravenous PRN Lora PaulaJennifer T Krall, MD 10 mL/hr at 12/06/16 1000 250 mL at 12/06/16 1000  . 0.9 %  sodium chloride infusion   Intravenous Continuous Kalman ShanMurali Ramaswamy, MD 10 mL/hr at 12/07/16 1245    . apixaban (ELIQUIS) tablet 2.5 mg  2.5 mg Oral BID Silvana Newnessndrew D Meyer, RPH   2.5 mg at 12/09/16 1036  . atorvastatin (LIPITOR) tablet 40 mg  40 mg Oral q1800 Jake BatheMark C Skains, MD      . chlorhexidine (PERIDEX) 0.12 % solution 15 mL  15 mL Mouth Rinse BID Nelda Bucksaniel J Feinstein, MD   15 mL at 12/09/16 1035  . furosemide (LASIX) injection 60 mg  60 mg Intravenous BID Lora PaulaJennifer T Krall, MD   60 mg at 12/09/16 1032  . insulin aspart (novoLOG) injection 0-9 Units  0-9 Units Subcutaneous TID WC Kalman ShanMurali Ramaswamy, MD   5 Units at 12/09/16 1200  . ipratropium-albuterol (DUONEB) 0.5-2.5 (3) MG/3ML nebulizer solution 3 mL  3 mL Nebulization TID Kalman ShanMurali Ramaswamy, MD   3 mL at 12/09/16 1412  . ipratropium-albuterol (DUONEB) 0.5-2.5 (3) MG/3ML nebulizer solution 3 mL  3 mL Nebulization Q6H  PRN Kalman Shan, MD      . levofloxacin (LEVAQUIN) IVPB 500 mg  500 mg Intravenous Q48H Bertram Millard, RPH   500 mg at 12/08/16 0217  . MEDLINE mouth rinse  15 mL Mouth Rinse q12n4p Nelda Bucks, MD   15 mL at 12/08/16 1600     Discharge Medications: Please see discharge summary for a list of discharge medications.  Relevant Imaging Results:  Relevant Lab Results:   Additional Information SSN:  [id number]  Raye Sorrow, [place]

## 2016-12-09 NOTE — Clinical Social Work Note (Signed)
Clinical Social Work Assessment  Patient Details  Name: Tammy Fitzgerald MRN: [id number] Date of Birth: 16-Jun-1933  Date of referral:  12/09/16               Reason for consult:  Facility Placement, Discharge Planning                Permission sought to share information with:  Case Manager, Facility Medical sales representativeContact Representative, Family Supports Permission granted to share information::  Yes, Verbal Permission Granted  Name::        Agency::     Relationship::  Daughter: Margit BandaBobbie Rose, [phone]  Contact Information:     Housing/Transportation Living arrangements for the past 2 months:  Single Family Home Source of Information:  Patient, Medical Team, Case Manager Patient Interpreter Needed:  None Criminal Activity/Legal Involvement Pertinent to Current Situation/Hospitalization:  No - Comment as needed Significant Relationships:  Adult Children, Other Family Members Lives with:  Adult Children Do you feel safe going back to the place where you live?  No Need for family participation in patient care:  Yes (Comment)  Care giving concerns:  Patient reports that she lives with her daughter at home in [place].  Reports she is open to recommendations for ST SNF and would like daughter involved who also helps care for her. Patient agreeable for SNF work up with placement in [place].   Social Worker assessment / plan:  LCSW completed consult with patient in room. She was alone and no family, but gave contact information for daughter Tammy Fitzgerald.  Bobbie contacted, unable to reach via phone and no way to leave message.  Patient from home and recommendation is ST SNF.  She is agreeable.  Patient reports she has memory problems and SNF may be beneficial in effort to help make her stronger.    LCSW has completed SNF work up and called daughter, unable to reach.  Will continue to try and reach.  Plan:  SNF at DC.  Patient requesting placement in [place] if possible.  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  Appears to be amendable, and would like daughter involved.  Emotional Assessment Appearance:  Appears stated age Attitude/Demeanor/Rapport:    Affect (typically observed):  Accepting, Adaptable, Pleasant Orientation:  Oriented to Self, Oriented to Place Alcohol / Substance use:  Not Applicable Psych involvement (Current and /or in the community):  No (Comment)  Discharge Needs  Concerns to be addressed:  No discharge needs identified Readmission within the last 30 days:  No Current discharge risk:  None Barriers to Discharge:  Continued Medical Work up   Tammy Fitzgerald, Ranay Ketter N, LCSW 12/09/2016, 2:30 PM

## 2016-12-09 NOTE — Progress Notes (Signed)
Pharmacist Heart Failure Core Measure Documentation  Assessment: Tammy CowboyDixie Stigler has an EF documented as 25-30% on 12/07/15 by Echo.  Rationale: Heart failure patients with left ventricular systolic dysfunction (LVSD) and an EF < 40% should be prescribed an angiotensin converting enzyme inhibitor (ACEI) or angiotensin receptor blocker (ARB) at discharge unless a contraindication is documented in the medical record.  This patient is not currently on an ACEI or ARB for HF.  This note is being placed in the record in order to provide documentation that a contraindication to the use of these agents is present for this encounter.  ACE Inhibitor or Angiotensin Receptor Blocker is contraindicated (specify all that apply)  []   ACEI allergy AND ARB allergy []   Angioedema []   Moderate or severe aortic stenosis []   Hyperkalemia [x]   Hypotension []   Renal artery stenosis [x]   Worsening renal function, preexisting renal disease or dysfunction  Harland Germanndrew Jezelle Gullick, Pharm D 12/09/2016 1:29 PM

## 2016-12-09 NOTE — Progress Notes (Signed)
ANTICOAGULATION CONSULT NOTE Pharmacy Consult for Heparin>> apixiban Indication: chest pain/ACS  Allergies  Allergen Reactions  . Meperidine And Related Anaphylaxis  . Penicillins Anaphylaxis  . Aspirin Hives  . Pneumovax 23 [Pneumococcal Vac Polyvalent] Hives and Swelling   Patient Measurements: Height: 5\' 5"  (165.1 cm) Weight: 170 lb (77.1 kg) IBW/kg (Calculated) : 57  Vital Signs: Temp: 97.8 F (36.6 C) (01/22 0557) Temp Source: Oral (01/22 0557) BP: 94/59 (01/22 0557) Pulse Rate: 108 (01/21 2202)  Labs:  Recent Labs  12/06/16 1228  12/06/16 1852 12/07/16 0214 12/07/16 2355 12/08/16 0317 12/08/16 1301 12/09/16 0331  HGB  --   --   --  9.7*  --  9.2*  --  10.1*  HCT  --   --   --  28.9*  --  27.7*  --  31.0*  PLT  --   --   --  201  --  212  --  261  HEPARINUNFRC  --   < > 0.30 0.33  --  0.25* 0.26* 0.49  CREATININE  --   --   --  1.84* 1.83*  --   --  1.51*  TROPONINI 52.87*  --  59.09*  --   --  13.74*  --   --   < > = values in this interval not displayed.  Estimated Creatinine Clearance: 30 mL/min (by C-G formula based on SCr of 1.51 mg/dL (H)).   Assessment: 83 y.o. female with Afib and NSTEMI (Plans for conservative management) on heparin. Pharmacy consulted to dose apixiban.  SCr= 1.51 and fits criteria for 2.5mg  po bid dosing.  Goal of Therapy:  Heparin level 0.3-0.7 units/ml Monitor platelets by anticoagulation protocol: Yes   Plan:  -Begin apixiban 2.5mg  po bid -will provide patient education  Harland Germanndrew Twisha Vanpelt, Pharm D 12/09/2016 9:30 AM

## 2016-12-09 NOTE — Discharge Instructions (Signed)

## 2016-12-10 LAB — CBC
HCT: 30.6 % — ABNORMAL LOW (ref 36.0–46.0)
Hemoglobin: 10 g/dL — ABNORMAL LOW (ref 12.0–15.0)
MCH: 27.8 pg (ref 26.0–34.0)
MCHC: 32.7 g/dL (ref 30.0–36.0)
MCV: 85 fL (ref 78.0–100.0)
PLATELETS: 243 10*3/uL (ref 150–400)
RBC: 3.6 MIL/uL — AB (ref 3.87–5.11)
RDW: 15.5 % (ref 11.5–15.5)
WBC: 9 10*3/uL (ref 4.0–10.5)

## 2016-12-10 LAB — GLUCOSE, CAPILLARY
GLUCOSE-CAPILLARY: 230 mg/dL — AB (ref 65–99)
Glucose-Capillary: 307 mg/dL — ABNORMAL HIGH (ref 65–99)
Glucose-Capillary: 198 mg/dL — ABNORMAL HIGH (ref 65–99)
Glucose-Capillary: 251 mg/dL — ABNORMAL HIGH (ref 65–99)

## 2016-12-10 LAB — PHOSPHORUS: Phosphorus: 3.2 mg/dL (ref 2.5–4.6)

## 2016-12-10 LAB — MAGNESIUM: MAGNESIUM: 1.6 mg/dL — AB (ref 1.7–2.4)

## 2016-12-10 MED ORDER — MAGNESIUM SULFATE 2 GM/50ML IV SOLN
2.0000 g | Freq: Once | INTRAVENOUS | Status: AC
  Administered 2016-12-10: 2 g via INTRAVENOUS
  Filled 2016-12-10: qty 50

## 2016-12-10 MED ORDER — METOPROLOL TARTRATE 12.5 MG HALF TABLET
12.5000 mg | ORAL_TABLET | Freq: Two times a day (BID) | ORAL | Status: DC
  Administered 2016-12-10: 12.5 mg via ORAL
  Filled 2016-12-10 (×2): qty 1

## 2016-12-10 MED ORDER — IPRATROPIUM-ALBUTEROL 0.5-2.5 (3) MG/3ML IN SOLN: 3.0000 mL | Freq: Three times a day (TID) | RESPIRATORY_TRACT | 0 refills | Status: AC

## 2016-12-10 MED ORDER — DOCUSATE SODIUM 100 MG PO CAPS
100.0000 mg | ORAL_CAPSULE | Freq: Two times a day (BID) | ORAL | Status: DC
  Administered 2016-12-10 – 2016-12-11 (×2): 100 mg via ORAL
  Filled 2016-12-10 (×2): qty 1

## 2016-12-10 MED ORDER — METOPROLOL TARTRATE 25 MG PO TABS: 12.5000 mg | ORAL_TABLET | Freq: Two times a day (BID) | ORAL | 0 refills | Status: AC

## 2016-12-10 MED ORDER — ATORVASTATIN CALCIUM 40 MG PO TABS: 40.0000 mg | ORAL_TABLET | Freq: Every day | ORAL | 0 refills | Status: AC

## 2016-12-10 MED ORDER — IPRATROPIUM-ALBUTEROL 0.5-2.5 (3) MG/3ML IN SOLN: 3.0000 mL | Freq: Four times a day (QID) | RESPIRATORY_TRACT | 0 refills | Status: AC | PRN

## 2016-12-10 MED ORDER — APIXABAN 2.5 MG PO TABS: 2.5000 mg | ORAL_TABLET | Freq: Two times a day (BID) | ORAL | 0 refills | Status: AC

## 2016-12-10 MED ORDER — FUROSEMIDE 40 MG PO TABS
40.0000 mg | ORAL_TABLET | Freq: Every day | ORAL | Status: DC
  Administered 2016-12-11: 40 mg via ORAL
  Filled 2016-12-10: qty 1

## 2016-12-10 MED ORDER — INSULIN ASPART 100 UNIT/ML ~~LOC~~ SOLN: 0.0000 [IU] | Freq: Three times a day (TID) | SUBCUTANEOUS | 11 refills | Status: AC

## 2016-12-10 MED ORDER — POLYETHYLENE GLYCOL 3350 17 G PO PACK
17.0000 g | PACK | Freq: Two times a day (BID) | ORAL | Status: DC
  Administered 2016-12-10 – 2016-12-11 (×2): 17 g via ORAL
  Filled 2016-12-10 (×2): qty 1

## 2016-12-10 MED ORDER — LEVOFLOXACIN 750 MG PO TABS: 750.0000 mg | ORAL_TABLET | ORAL | Status: DC

## 2016-12-10 MED ORDER — FUROSEMIDE 40 MG PO TABS: 40.0000 mg | ORAL_TABLET | Freq: Every day | ORAL | 0 refills | Status: AC

## 2016-12-10 NOTE — Procedures (Signed)
Placed patient on BIPAP auto for the night.  Patient is tolerating well at this time. 

## 2016-12-10 NOTE — Care Management Important Message (Signed)
Important Message  Patient Details  Name: Tammy Fitzgerald MRN: [id number] Date of Birth: 06-02-1933   Medicare Important Message Given:  Yes    Kyla BalzarineShealy, Savanna Dooley Abena 12/10/2016, 12:59 PM

## 2016-12-10 NOTE — Clinical Social Work Note (Signed)
[facility]. Discharge summary and orders must be in by 12:00 pm at the latest. [facility], approved by Chiropodistassistant director of social work. CSW paged MD to notify. CSW has also notified patient's daughter.  Charlynn CourtSarah Asha Grumbine, CSW [phone]

## 2016-12-10 NOTE — Discharge Summary (Addendum)
Physician Discharge Summary  Tammy Fitzgerald RSW:[id number] DOB: [birth year] DOA: 12/06/2016  PCP: No primary care provider on file.  Admit date: 12/06/2016 Discharge date: 12/11/2016  Recommendations for Outpatient Follow-up:  1. Continue apixaban 2.5 mg twice a day. 2. Hold metoprolol if blood pressure less than 120/80 or if heart rate less than 60.  Discharge Diagnoses:  Principal Problem:   Acute respiratory failure ([place]) Active Problems:   Pneumonia   NSTEMI (non-ST elevated myocardial infarction) [facility])   Essential hypertension   New onset atrial fibrillation [facility])    Discharge Condition: stable   Diet recommendation: as tolerated   History of present illness:  83 y.o. Female with past medical history of hypertension, diabetes (on metformin at home), dyslipidemia who was transferred from [facility] hospital to [facility] 1/19 due to worsening shortness of breath and generalized weakness for past 1-2 weeks prior to this admission. Pt also reported right sided pleuritic chest pain occasionally radiating to right shoulder aggravated by movement. She also had orthopnea, PND and lower extremity swelling. She also had cough productive of whitish sputum for past 2-3 weeks. Pt reported few episodes of diarrhea few days prior to the admission.  In [facility] she was found to have lactic acid at 6.4 ,trop. 4.1,BNP 13446, BUN 57,and Cr. 2.3. she was transferred to [facility] for further care and management.  Cardio consulted for NSTEMI but family opted for conservative care.  TRH assumed car as of 12/08/16.  Hospital Course:  Principal Problem:   Acute respiratory failure with hypoxia (HCC) /  Ischemic CM with severe MR and pulmonary hypertension / Acute systolic and diastolic CHF - Appreciate cardio following and recommendations  - Pt and family prefer conservative approach - Continue lasix but stop 60 mg IV BID and use 40 mg PO daily  - Continue duoneb every 8 hours scheduled - Stable  respiratory status   Active Problems: Sepsis secondary to bibasilar pneumonia / Leukocytosis  - Sepsis criteria met with leukocytosis, lactic acidosis, tachypnea, tachycardia and evidence of infection on CXR - suspected bibasilar pneumonia  - Continue Levaquin Q 48 hours  - Blood cx showed no growth   NSTEMI (non-ST elevated myocardial infarction) (HCC) - Robust elevation in cardiac markers - Per patient's daughter - opted for conservative approach   - Trop levels peaked and now better, trop 13.74 - Has aspirin allergy - Cardio started apixaban 2/5 mg bID and heparin drip stopped today 1/22 - Added Lipitor 40 mg at bedtime 1/22  Afib with RVR, new onset  - Chads2vasc score is at least 3 - Started apixaban 1/22 - MAT on tele, cardio started low dose metoprolol 12.5 mg BID   Hypomagnesemia - Supplement today - Check magnesium level in am   Diabetes mellitus with peripheral circulatory complications without long term insulin use - Metformin on hold due to renal insufficiency - Continue SSI on discharge   Dyslipidemia associated with type 2 DM - Added Lipitor   Acute kidney injury - Likely CKD considering degree of anemia but no previous values for comparison - Cr 1.8 --> 1.5  HTN, essential - Continue lasix  - Due to MAT on telemetry, cardio added low dose metoprolol today 1/22  Dementia without behavioral disturbance  - Stable    DVT prophylaxis: Started apixaban 1/22 Code Status: DNR/DNI Family Communication: no family at the bedside this am; called pt son at [phone], no option to leave VM    Consultants:   Cardiology   Procedures:   ECHO  12/02/2016 - EF 25-30%, diffuse hypokinesis   Antimicrobials:   Levaquin 12/06/2016 --> 1/24   Signed:  Leisa Lenz, MD  [facility] 12/10/2016, 5:17 PM  Pager #: [phone]  Time spent in minutes: less than 30 minutes   Discharge Exam: Vitals:   12/10/16 0707 12/10/16 1139  BP:  97/66 (!) 91/53  Pulse: (!) 132 97  Resp:    Temp: 98 F (36.7 C)    Vitals:   12/10/16 0650 12/10/16 0707 12/10/16 1139 12/10/16 1526  BP:  97/66 (!) 91/53   Pulse:  (!) 132 97   Resp:      Temp:  98 F (36.7 C)    TempSrc:  Oral    SpO2:  100% 100% 99%  Weight: 77.5 kg (170 lb 12.8 oz) 77.1 kg (170 lb 1.3 oz)    Height:        General: Pt is alert, follows commands appropriately, not in acute distress Cardiovascular: Rate controlled, S1/S2 + Respiratory: Clear to auscultation bilaterally, no wheezing, no crackles, no rhonchi Abdominal: Soft, non tender, non distended, bowel sounds +, no guarding Extremities: no cyanosis, pulses palpable bilaterally DP and PT Neuro: Grossly nonfocal  Discharge Instructions  Discharge Instructions    Call MD for:  persistant nausea and vomiting    Complete by:  As directed    Call MD for:  redness, tenderness, or signs of infection (pain, swelling, redness, odor or green/yellow discharge around incision site)    Complete by:  As directed    Call MD for:  severe uncontrolled pain    Complete by:  As directed    Diet - low sodium heart healthy    Complete by:  As directed    Increase activity slowly    Complete by:  As directed      Allergies as of 12/11/2016      Reactions   Meperidine And Related Anaphylaxis   Penicillins Anaphylaxis   Aspirin Hives   Pneumovax 23 [pneumococcal Vac Polyvalent] Hives, Swelling      Medication List    STOP taking these medications   lisinopril-hydrochlorothiazide 20-12.5 MG tablet Commonly known as:  PRINZIDE,ZESTORETIC   metFORMIN 1000 MG tablet Commonly known as:  GLUCOPHAGE   simvastatin 40 MG tablet Commonly known as:  ZOCOR     TAKE these medications   apixaban 2.5 MG Tabs tablet Commonly known as:  ELIQUIS Take 1 tablet (2.5 mg total) by mouth 2 (two) times daily.   atorvastatin 40 MG tablet Commonly known as:  LIPITOR Take 1 tablet (40 mg total) by mouth daily at 6 PM.    docusate sodium 100 MG capsule Commonly known as:  COLACE Take 1 capsule (100 mg total) by mouth 2 (two) times daily.   donepezil 5 MG tablet Commonly known as:  ARICEPT Take 5 mg by mouth at bedtime.   fluticasone 50 MCG/ACT nasal spray Commonly known as:  FLONASE Place 1 spray into both nostrils daily as needed for allergies or rhinitis.   furosemide 40 MG tablet Commonly known as:  LASIX Take 1 tablet (40 mg total) by mouth daily.   insulin aspart 100 UNIT/ML injection Commonly known as:  novoLOG Inject 0-9 Units into the skin 3 (three) times daily with meals.   ipratropium-albuterol 0.5-2.5 (3) MG/3ML Soln Commonly known as:  DUONEB Take 3 mLs by nebulization every 6 (six) hours as needed.   ipratropium-albuterol 0.5-2.5 (3) MG/3ML Soln Commonly known as:  DUONEB Take 3 mLs by nebulization 3 (  three) times daily.   loperamide 2 MG tablet Commonly known as:  IMODIUM A-D Take 2 mg by mouth 2 (two) times daily.   meclizine 25 MG tablet Commonly known as:  ANTIVERT Take 12.5 mg by mouth 2 (two) times daily as needed. Takes 1/2 of 20m twice daily if she has vertigo   metoprolol tartrate 25 MG tablet Commonly known as:  LOPRESSOR Take 0.5 tablets (12.5 mg total) by mouth 2 (two) times daily.   omeprazole 20 MG capsule Commonly known as:  PRILOSEC Take 20 mg by mouth 2 (two) times a week. Takes on Wednesday and Saturday   potassium chloride 10 MEQ tablet Commonly known as:  K-DUR Take 1 tablet (10 mEq total) by mouth daily.          The results of significant diagnostics from this hospitalization (including imaging, microbiology, ancillary and laboratory) are listed below for reference.    Significant Diagnostic Studies: Dg Chest Port 1 View  Result Date: 12/08/2016 CLINICAL DATA:  Acute on chronic respiratory failure EXAM: PORTABLE CHEST 1 VIEW COMPARISON:  December 06, 2016 FINDINGS: No pneumothorax. Bilateral interstitial opacities, right greater than left,  persist but are improved. Bilateral pleural effusions with underlying atelectasis are stable on the left and more prominent on the right in the interval. No other interval change. IMPRESSION: 1. Probable asymmetric edema, improved in the interval. 2. The right pleural effusion and underlying opacity are more prominent/larger in the interval. The smaller left effusion and underlying opacity are stable. Electronically Signed   By: DDorise BullionIII M.D   On: 12/08/2016 09:33   Dg Chest Port 1 View  Result Date: 12/06/2016 CLINICAL DATA:  Shortness of Breath EXAM: PORTABLE CHEST 1 VIEW COMPARISON:  None. FINDINGS: There is widespread interstitial edema. There are small pleural effusions bilaterally. There is patchy airspace consolidation in the bases. There is cardiomegaly with pulmonary venous hypertension. There is aortic atherosclerosis. There is an apparent hiatal type hernia. No adenopathy is appreciable. There is degenerative change in each shoulder. IMPRESSION: Evidence of congestive heart failure. Question alveolar edema versus superimposed pneumonia in the bases. Both entities may exist concurrently. There is aortic atherosclerosis. Apparent hiatal type hernia. Electronically Signed   By: WLowella GripIII M.D.   On: 12/06/2016 07:16    Microbiology: Recent Results (from the past 240 hour(s))  MRSA PCR Screening     Status: None   Collection Time: 12/06/16  5:43 AM  Result Value Ref Range Status   MRSA by PCR NEGATIVE NEGATIVE Final    Comment:        The GeneXpert MRSA Assay (FDA approved for NASAL specimens only), is one component of a comprehensive MRSA colonization surveillance program. It is not intended to diagnose MRSA infection nor to guide or monitor treatment for MRSA infections.   Culture, blood (routine x 2)     Status: None (Preliminary result)   Collection Time: 12/06/16 10:20 AM  Result Value Ref Range Status   Specimen Description BLOOD RIGHT HAND  Final    Special Requests BOTTLES DRAWN AEROBIC ONLY 5CC  Final   Culture NO GROWTH 4 DAYS  Final   Report Status PENDING  Incomplete  Culture, blood (routine x 2)     Status: None (Preliminary result)   Collection Time: 12/06/16 10:20 AM  Result Value Ref Range Status   Specimen Description BLOOD RIGHT HAND  Final   Special Requests IN PEDIATRIC BOTTLE 1.5CC  Final   Culture NO GROWTH 4 DAYS  Final   Report Status PENDING  Incomplete     Labs: Basic Metabolic Panel:  Recent Labs Lab 12/06/16 0940 12/07/16 0214 12/07/16 2355 12/08/16 0317 12/09/16 0331 12/10/16 0343  NA 131* 136 135  --  136  --   K 5.0 3.5 3.6  --  3.8  --   CL 98* 101 99*  --  96*  --   CO2 17* 22 22  --  26  --   GLUCOSE 347* 140* 213*  --  291*  --   BUN 59* 60* 68*  --  57*  --   CREATININE 2.10* 1.84* 1.83*  --  1.51*  --   CALCIUM 8.3* 8.7* 8.6*  --  9.0  --   MG 1.8  --   --  2.3 1.9 1.6*  PHOS 4.0  --   --  4.3 3.9 3.2   Liver Function Tests:  Recent Labs Lab 12/06/16 0940  AST 106*  ALT 33  ALKPHOS 77  BILITOT 1.6*  PROT 6.8  ALBUMIN 3.1*   No results for input(s): LIPASE, AMYLASE in the last 168 hours. No results for input(s): AMMONIA in the last 168 hours. CBC:  Recent Labs Lab 12/06/16 0940 12/07/16 0214 12/08/16 0317 12/09/16 0331 12/10/16 0343  WBC 5.6 15.0* 9.9 9.3 9.0  NEUTROABS 5.0  --   --   --   --   HGB 11.5* 9.7* 9.2* 10.1* 10.0*  HCT 35.1* 28.9* 27.7* 31.0* 30.6*  MCV 85.8 84.3 84.5 84.9 85.0  PLT 172 201 212 261 243   Cardiac Enzymes:  Recent Labs Lab 12/06/16 0940 12/06/16 1228 12/06/16 1852 12/08/16 0317  TROPONINI 21.62* 52.87* 59.09* 13.74*   BNP: BNP (last 3 results)  Recent Labs  12/06/16 0800  BNP 1,681.8*    ProBNP (last 3 results) No results for input(s): PROBNP in the last 8760 hours.  CBG:  Recent Labs Lab 12/09/16 1635 12/09/16 2126 12/10/16 0707 12/10/16 1141 12/10/16 1632  GLUCAP 243* 321* 307* 198* 251*

## 2016-12-10 NOTE — Progress Notes (Signed)
Progress Note  Patient Name: Tammy Fitzgerald Date of Encounter: 12/10/2016  Primary Cardiologist: Dr. Delton SeeNelson ([place])  Subjective   No new complaints, no CP. SOB with activity. Getting better. Smiles. Wants to know when she can go to SNF.   Inpatient Medications    Scheduled Meds: . apixaban  2.5 mg Oral BID  . atorvastatin  40 mg Oral q1800  . chlorhexidine  15 mL Mouth Rinse BID  . furosemide  60 mg Intravenous BID  . insulin aspart  0-9 Units Subcutaneous TID WC  . ipratropium-albuterol  3 mL Nebulization TID  . [START ON 12/11/2016] levofloxacin  750 mg Oral Q48H  . mouth rinse  15 mL Mouth Rinse q12n4p  . metoprolol tartrate  12.5 mg Oral BID   Continuous Infusions: . sodium chloride 10 mL/hr at 12/07/16 1245   PRN Meds: sodium chloride, ipratropium-albuterol   Vital Signs    Vitals:   12/09/16 2306 12/10/16 0451 12/10/16 0650 12/10/16 0707  BP:  99/64  97/66  Pulse: (!) 131 (!) 129  (!) 132  Resp: 18 20    Temp:  97.8 F (36.6 C)  98 F (36.7 C)  TempSrc:  Oral  Oral  SpO2: 96% 100%  100%  Weight:   170 lb 12.8 oz (77.5 kg) 170 lb 1.3 oz (77.1 kg)  Height:        Intake/Output Summary (Last 24 hours) at 12/10/16 1018 Last data filed at 12/09/16 2130  Gross per 24 hour  Intake              480 ml  Output              600 ml  Net             -120 ml   Filed Weights   12/09/16 0557 12/10/16 0650 12/10/16 0707  Weight: 170 lb (77.1 kg) 170 lb 12.8 oz (77.5 kg) 170 lb 1.3 oz (77.1 kg)    Telemetry    NSR (prior AFIB), now MAT (p waves seen prior to QRS) - Personally Reviewed  ECG    NSR ST depression diffuse, aVR elevation - Personally Reviewed  Physical Exam   GEN: No acute distress, elderly Neck: No JVD Cardiac: RRR, no murmurs, rubs, or gallops.  Respiratory: Decreased BS at bases to auscultation bilaterally. Increased resp rate. GI: Soft, nontender, non-distended  MS:  1+ edema; No deformity. Neuro:  Alert. Psych: Conversant  Labs      Chemistry  Recent Labs Lab 12/06/16 0940 12/07/16 0214 12/07/16 2355 12/09/16 0331  NA 131* 136 135 136  K 5.0 3.5 3.6 3.8  CL 98* 101 99* 96*  CO2 17* 22 22 26   GLUCOSE 347* 140* 213* 291*  BUN 59* 60* 68* 57*  CREATININE 2.10* 1.84* 1.83* 1.51*  CALCIUM 8.3* 8.7* 8.6* 9.0  PROT 6.8  --   --   --   ALBUMIN 3.1*  --   --   --   AST 106*  --   --   --   ALT 33  --   --   --   ALKPHOS 77  --   --   --   BILITOT 1.6*  --   --   --   GFRNONAA 21* 25* 25* 31*  GFRAA 24* 29* 29* 36*  ANIONGAP 16* 13 14 14      Hematology  Recent Labs Lab 12/08/16 0317 12/09/16 0331 12/10/16 0343  WBC 9.9 9.3 9.0  RBC  3.28* 3.65* 3.60*  HGB 9.2* 10.1* 10.0*  HCT 27.7* 31.0* 30.6*  MCV 84.5 84.9 85.0  MCH 28.0 27.7 27.8  MCHC 33.2 32.6 32.7  RDW 15.3 15.2 15.5  PLT 212 261 243    Cardiac Enzymes  Recent Labs Lab 12/06/16 0940 12/06/16 1228 12/06/16 1852 12/08/16 0317  TROPONINI 21.62* 52.87* 59.09* 13.74*   No results for input(s): TROPIPOC in the last 168 hours.   BNP  Recent Labs Lab 12/06/16 0800  BNP 1,681.8*     DDimer No results for input(s): DDIMER in the last 168 hours.   Radiology    No results found.  Cardiac Studies   - Left ventricle: The cavity size was normal. Wall thickness was   increased in a pattern of mild LVH. Systolic function was   severely reduced. The estimated ejection fraction was in the   range of 25% to 30%. Diffuse hypokinesis. There was no evidence   of elevated ventricular filling pressure by Doppler parameters. - Aortic valve: Cusp separation was reduced. - Mitral valve: Calcified annulus. Mildly thickened leaflets .   There was severe regurgitation directed centrally. - Pulmonary arteries: Systolic pressure was moderately to severely   increased. PA peak pressure: 67 mm Hg (S). - Pericardium, extracardiac: A trivial pericardial effusion was   identified.  Patient Profile     83 y.o. female with NSTEMI Trop peak 60 with EF  25%, ischemic cardiomyopathy, ECG suggestive of 3v disease, acute systolic heart failure  Assessment & Plan    NSTEMI  - non invasive management, family discussion, no Cath  - ASA allergy, will add statin atorvastatin 40  - will add low dose metoprolol 12.5 BID, mild hypotension.   - Off heparin IV (trop trending down, 48hrs)  PAF/atrial flutter/MAT  - now MAT present. Will add low dose metoprolol. HR 106  - prior aflutter HR 141, 2:1 conduction  - recommend Eliquis 2.5 BID (Greater than 80 with Creat >1.5). Daughter OK with this. She is familiar with this medication  Dementia  - no change. Daughter states she has good days and bad.   Acute systolic heart failure with ischemic CM/severe MR  - medical supportive care  - change IV lasix to PO 40 QD.   - renal function improving  DNR  - poor prognosis with NSTEMI.   Weakness  - PT recs SNF. Daughter is concerned about being able to take care of her at home.   Would be fine with DC from cardiac perspective.   Signed, Donato Schultz, MD  12/10/2016, 10:18 AM

## 2016-12-10 NOTE — Progress Notes (Signed)
Inpatient Diabetes Program Recommendations  AACE/ADA: New Consensus Statement on Inpatient Glycemic Control (2015)  Target Ranges:  Prepandial:   less than 140 mg/dL      Peak postprandial:   less than 180 mg/dL (1-2 hours)      Critically ill patients:  140 - 180 mg/dL   Lab Results  Component Value Date   GLUCAP 307 (H) 12/10/2016    Review of Glycemic Control Results for Tammy Fitzgerald, [place] (MRN [id number]) as of 12/10/2016 09:07  Ref. Range 12/09/2016 05:52 12/09/2016 11:26 12/09/2016 16:35 12/09/2016 21:26 12/10/2016 07:07  Glucose-Capillary Latest Ref Range: 65 - 99 mg/dL 409280 (H) 811280 (H) 914243 (H) 321 (H) 307 (H)   Inpatient Diabetes Program Recommendations:  May consider Lantus 8 units daily (approx 0.1 unit/kg) for comfort.  Thank you, Billy FischerJudy E. Shuntae Herzig, RN, MSN, CDE Inpatient Glycemic Control Team Team Pager [phone] (8am-5pm) 12/10/2016 9:17 AM

## 2016-12-10 NOTE — Progress Notes (Signed)
PHARMACIST - PHYSICIAN COMMUNICATION  CONCERNING: Antibiotic IV to Oral Route Change Policy  RECOMMENDATION: This patient is receiving levaquin by the intravenous route.  Based on criteria approved by the Pharmacy and Therapeutics Committee, the antibiotic(s) is/are being converted to the equivalent oral dose form(s).   DESCRIPTION: These criteria include:  Patient being treated for a respiratory tract infection, urinary tract infection, cellulitis or clostridium difficile associated diarrhea if on metronidazole  The patient is not neutropenic and does not exhibit a GI malabsorption state  The patient is eating (either orally or via tube) and/or has been taking other orally administered medications for a least 24 hours  The patient is improving clinically and has a Tmax < 100.5  If you have questions about this conversion, please contact the Pharmacy Department  []   [phone]( [phone] )  Tammy Fitzgerald []   [phone]( [phone] )  [facility] [x]   [phone]( [phone] )  Tammy Fitzgerald []   [phone]( [phone]  [facility] []   [phone]( [phone] )  Tammy Fitzgerald   -I have also adjusted levaquin for renal function.  Thank you,   Tammy Fitzgerald, Pharm D 12/10/2016 9:40 AM

## 2016-12-10 NOTE — Progress Notes (Signed)
Patient ID: Tammy Fitzgerald, female   DOB: [birth year], 83 y.o.   MRN: [id number]  PROGRESS NOTE    Tammy Fitzgerald  DJS:[id number] DOB: Oct 11, 1933 DOA: 12/06/2016  PCP: No primary care provider on file.   Brief Narrative:  83 y.o. Female with past medical history of hypertension, diabetes (on metformin at home), dyslipidemia who was transferred from [facility] hospital to [facility] 1/19 due to worsening shortness of breath and generalized weakness for past 1-2 weeks prior to this admission. Pt also reported right sided pleuritic chest pain occasionally radiating to right shoulder aggravated by movement. She also had orthopnea, PND and lower extremity swelling. She also had cough productive of whitish sputum for past 2-3 weeks. Pt reported few episodes of diarrhea few days prior to the admission.  In [facility] she was found to have lactic acid at 6.4 ,trop. 4.1,BNP 13446, BUN 57,and Cr. 2.3. she was transferred to [facility] for further care and management.  Cardio consulted for NSTEMI but family opted for conservative care.  TRH assumed car as of 12/08/16.   Assessment & Plan:   Principal Problem:   Acute respiratory failure with hypoxia (HCC) /  Ischemic CM with severe MR and pulmonary hypertension / Acute systolic and diastolic CHF - Appreciate cardio following and recommendations  - Pt and family prefer conservative approach - Continue lasix but stop 60 mg IV BID and use 40 mg PO daily  - Continue duoneb every 8 hours scheduled - Stable respiratory status   Active Problems: Sepsis secondary to bibasilar pneumonia / Leukocytosis  - Sepsis criteria met with leukocytosis, lactic acidosis, tachypnea, tachycardia and evidence of infection on CXR - suspected bibasilar pneumonia  - Continue Levaquin Q 48 hours  - Blood cx showed no growth   NSTEMI (non-ST elevated myocardial infarction) (HCC) - Robust elevation in cardiac markers - Per patient's daughter - opted for conservative approach    -  Trop levels peaked and now better, trop 13.74 - Has aspirin allergy - Cardio started apixaban 2/5 mg bID and heparin drip stopped today 1/22 - Added Lipitor 40 mg at bedtime today 1/22  Afib with RVR, new onset  - Chads2vasc score is at least 3 - Started apixaban 1/22 - MAT on tele, cardio started low dose metoprolol 12.5 mg BID   Hypomagnesemia - Supplement today - Check magnesium level in am   Diabetes mellitus with peripheral circulatory complications without long term insulin use - Metformin on hold due to renal insufficiency - Continue SSI in hospital - Add novolog 3 units TIDAC   Dyslipidemia associated with type 2 DM - Added Lipitor   Acute kidney injury - Likely CKD considering degree of anemia but no previous values for comparison - Cr 1.8 --> 1.5  HTN, essential - Continue lasix  - Due to MAT on telemetry, cardio added low dose metoprolol today 1/22  Dementia without behavioral disturbance  - Stable    DVT prophylaxis: Started apixaban 1/22 Code Status: DNR/DNI Family Communication: no family at the bedside this am; called pt son at [phone], no option to leave VM Disposition Plan: to SNF once bed available    Consultants:   Cardiology   Procedures:   ECHO 12/02/2016 - EF 25-30%, diffuse hypokinesis   Antimicrobials:   Levaquin 12/06/2016 -->   Subjective: No overnight events.   Objective: Vitals:   12/10/16 0451 12/10/16 0650 12/10/16 0707 12/10/16 1139  BP: 99/64  97/66 (!) 91/53  Pulse: (!) 129  (!) 132 97  Resp:  20     Temp: 97.8 F (36.6 C)  98 F (36.7 C)   TempSrc: Oral  Oral   SpO2: 100%  100% 100%  Weight:  77.5 kg (170 lb 12.8 oz) 77.1 kg (170 lb 1.3 oz)   Height:        Intake/Output Summary (Last 24 hours) at 12/10/16 1341 Last data filed at 12/09/16 2130  Gross per 24 hour  Intake              240 ml  Output              600 ml  Net             -360 ml   Filed Weights   12/09/16 0557 12/10/16 0650 12/10/16 0707    Weight: 77.1 kg (170 lb) 77.5 kg (170 lb 12.8 oz) 77.1 kg (170 lb 1.3 oz)    Examination:  General exam: Appears calm and comfortable, no distress  Respiratory system: Diminished breath sounds, no wheezing  Cardiovascular system: S1 & S2 (+), Rate controlled  Gastrointestinal system: (+) BS, non tender abd Central nervous system: Alert and oriented. No focal neurological deficits. Extremities: No edema, palpable pulses  Skin: skin is warm and dry  Psychiatry: Mood & affect appropriate.   Data Reviewed: I have personally reviewed following labs and imaging studies  CBC:  Recent Labs Lab 12/06/16 0940 12/07/16 0214 12/08/16 0317 12/09/16 0331 12/10/16 0343  WBC 5.6 15.0* 9.9 9.3 9.0  NEUTROABS 5.0  --   --   --   --   HGB 11.5* 9.7* 9.2* 10.1* 10.0*  HCT 35.1* 28.9* 27.7* 31.0* 30.6*  MCV 85.8 84.3 84.5 84.9 85.0  PLT 172 201 212 261 962   Basic Metabolic Panel:  Recent Labs Lab 12/06/16 0940 12/07/16 0214 12/07/16 2355 12/08/16 0317 12/09/16 0331 12/10/16 0343  NA 131* 136 135  --  136  --   K 5.0 3.5 3.6  --  3.8  --   CL 98* 101 99*  --  96*  --   CO2 17* 22 22  --  26  --   GLUCOSE 347* 140* 213*  --  291*  --   BUN 59* 60* 68*  --  57*  --   CREATININE 2.10* 1.84* 1.83*  --  1.51*  --   CALCIUM 8.3* 8.7* 8.6*  --  9.0  --   MG 1.8  --   --  2.3 1.9 1.6*  PHOS 4.0  --   --  4.3 3.9 3.2   GFR: Estimated Creatinine Clearance: 30 mL/min (by C-G formula based on SCr of 1.51 mg/dL (H)). Liver Function Tests:  Recent Labs Lab 12/06/16 0940  AST 106*  ALT 33  ALKPHOS 77  BILITOT 1.6*  PROT 6.8  ALBUMIN 3.1*   No results for input(s): LIPASE, AMYLASE in the last 168 hours. No results for input(s): AMMONIA in the last 168 hours. Coagulation Profile: No results for input(s): INR, PROTIME in the last 168 hours. Cardiac Enzymes:  Recent Labs Lab 12/06/16 0940 12/06/16 1228 12/06/16 1852 12/08/16 0317  TROPONINI 21.62* 52.87* 59.09* 13.74*   BNP  (last 3 results) No results for input(s): PROBNP in the last 8760 hours. HbA1C: No results for input(s): HGBA1C in the last 72 hours. CBG:  Recent Labs Lab 12/09/16 1126 12/09/16 1635 12/09/16 2126 12/10/16 0707 12/10/16 1141  GLUCAP 280* 243* 321* 307* 198*   Lipid Profile: No results for input(s): CHOL,  HDL, LDLCALC, TRIG, CHOLHDL, LDLDIRECT in the last 72 hours. Thyroid Function Tests: No results for input(s): TSH, T4TOTAL, FREET4, T3FREE, THYROIDAB in the last 72 hours. Anemia Panel: No results for input(s): VITAMINB12, FOLATE, FERRITIN, TIBC, IRON, RETICCTPCT in the last 72 hours. Urine analysis:    Component Value Date/Time   COLORURINE YELLOW 12/06/2016 1015   APPEARANCEUR HAZY (A) 12/06/2016 1015   LABSPEC 1.020 12/06/2016 1015   PHURINE 5.0 12/06/2016 1015   GLUCOSEU NEGATIVE 12/06/2016 1015   HGBUR NEGATIVE 12/06/2016 1015   [place] NEGATIVE 12/06/2016 [place] 12/06/2016 1015   PROTEINUR 30 (A) 12/06/2016 1015   NITRITE NEGATIVE 12/06/2016 1015   LEUKOCYTESUR NEGATIVE 12/06/2016 1015   Sepsis Labs: _0 (procalcitonin:4,lacticidven:4)   MRSA PCR Screening     Status: None   Collection Time: 12/06/16  5:43 AM  Result Value Ref Range Status   MRSA by PCR NEGATIVE NEGATIVE Final  Culture, blood (routine x 2)     Status: None (Preliminary result)   Collection Time: 12/06/16 10:20 AM  Result Value Ref Range Status   Specimen Description BLOOD RIGHT HAND  Final   Culture NO GROWTH < 24 HOURS  Final   Report Status PENDING  Incomplete  Culture, blood (routine x 2)     Status: None (Preliminary result)   Collection Time: 12/06/16 10:20 AM  Result Value Ref Range Status   Specimen Description BLOOD RIGHT HAND  Final   Special Requests IN PEDIATRIC BOTTLE 1.5CC  Final   Culture NO GROWTH < 24 HOURS  Final   Report Status PENDING  Incomplete      Radiology Studies: Dg Chest Port 1 View Result Date: 12/08/2016 1. Probable  asymmetric edema, improved in the interval. 2. The right pleural effusion and underlying opacity are more prominent/larger in the interval. The smaller left effusion and underlying opacity are stable.   Dg Chest Port 1 View Result Date: 12/06/2016 Evidence of congestive heart failure. Question alveolar edema versus superimposed pneumonia in the bases. Both entities may exist concurrently. There is aortic atherosclerosis. Apparent hiatal type hernia.    Scheduled Meds: . furosemide  60 mg Intravenous BID  . insulin aspart  0-9 Units Subcutaneous TID WC  . ipratropium-albuter  3 mL Nebulization TID  . levofloxacin (LEVAQUIN) IV  500 mg Intravenous Q48H   Continuous Infusions: . sodium chloride 10 mL/hr at 12/07/16 1245     LOS: 4 days    Time spent: 15 minutes  Greater than 50% of the time spent on counseling and coordinating the care.   Leisa Lenz, MD [facility] Pager [phone]  If 7PM-7AM, please contact night-coverage [email or web address] Password [email or web address] 12/10/2016, 1:41 PM

## 2016-12-11 ENCOUNTER — Encounter (HOSPITAL_COMMUNITY): Payer: Self-pay | Admitting: General Practice

## 2016-12-11 LAB — CBC
HEMATOCRIT: 30.8 % — AB (ref 36.0–46.0)
Hemoglobin: 10 g/dL — ABNORMAL LOW (ref 12.0–15.0)
MCH: 27.5 pg (ref 26.0–34.0)
MCHC: 32.5 g/dL (ref 30.0–36.0)
MCV: 84.8 fL (ref 78.0–100.0)
Platelets: 295 10*3/uL (ref 150–400)
RBC: 3.63 MIL/uL — ABNORMAL LOW (ref 3.87–5.11)
RDW: 15.2 % (ref 11.5–15.5)
WBC: 11.4 10*3/uL — ABNORMAL HIGH (ref 4.0–10.5)

## 2016-12-11 LAB — GLUCOSE, CAPILLARY
Glucose-Capillary: 255 mg/dL — ABNORMAL HIGH (ref 65–99)
Glucose-Capillary: 241 mg/dL — ABNORMAL HIGH (ref 65–99)

## 2016-12-11 LAB — BASIC METABOLIC PANEL
Anion gap: 12 (ref 5–15)
BUN: 47 mg/dL — AB (ref 6–20)
CHLORIDE: 93 mmol/L — AB (ref 101–111)
CO2: 30 mmol/L (ref 22–32)
Calcium: 8.9 mg/dL (ref 8.9–10.3)
Creatinine, Ser: 1.33 mg/dL — ABNORMAL HIGH (ref 0.44–1.00)
GFR calc Af Amer: 42 mL/min — ABNORMAL LOW (ref >60)
GFR calc non Af Amer: 36 mL/min — ABNORMAL LOW (ref >60)
GLUCOSE: 259 mg/dL — AB (ref 65–99)
POTASSIUM: 3.7 mmol/L (ref 3.5–5.1)
Sodium: 135 mmol/L (ref 135–145)

## 2016-12-11 LAB — PHOSPHORUS: PHOSPHORUS: 3.4 mg/dL (ref 2.5–4.6)

## 2016-12-11 LAB — CULTURE, BLOOD (ROUTINE X 2)
Culture: NO GROWTH
Culture: NO GROWTH

## 2016-12-11 LAB — MAGNESIUM: Magnesium: 2.3 mg/dL (ref 1.7–2.4)

## 2016-12-11 MED ORDER — BISACODYL 5 MG PO TBEC
10.0000 mg | DELAYED_RELEASE_TABLET | Freq: Every day | ORAL | Status: DC | PRN
  Administered 2016-12-11: 10 mg via ORAL
  Filled 2016-12-11: qty 2

## 2016-12-11 MED ORDER — DOCUSATE SODIUM 100 MG PO CAPS: 100.0000 mg | ORAL_CAPSULE | Freq: Two times a day (BID) | ORAL | 0 refills | Status: AC

## 2016-12-11 MED ORDER — POTASSIUM CHLORIDE ER 10 MEQ PO TBCR: 10.0000 meq | EXTENDED_RELEASE_TABLET | Freq: Every day | ORAL | 0 refills | Status: AC

## 2016-12-11 NOTE — Progress Notes (Signed)
Patient is medically stable for discharge today. Added parameters for metoprolol to hold it if blood pressure less than 120/80 or heart rate less than 60. Otherwise no changes in medical management since yesterday. Please refer to discharge summary completed yesterday 12/10/2016.  Tammy Passeylma Shajuana Fitzgerald [facility] [phone]

## 2016-12-11 NOTE — Progress Notes (Signed)
1135 Pt has not been able to walk with PT from their last note. Too weak. She is not appropriate for our services and is going to SNF today.  Luetta NuttingCharlene Jaynia Fendley RN BSN 12/11/2016 11:38 AM

## 2016-12-11 NOTE — Clinical Social Work Placement (Signed)
   CLINICAL SOCIAL WORK PLACEMENT  NOTE  Date:  12/11/2016  Patient Details  Name: [name] MRN: [id number] Date of Birth: 1933-03-09  Clinical Social Work is seeking post-discharge placement for this patient at the Skilled  Nursing Facility level of care (*CSW will initial, date and re-position this form in  chart as items are completed):  Yes   Patient/family provided with [place] Clinical Social Work Department's list of facilities offering this level of care within the geographic area requested by the patient (or if unable, by the patient's family).  Yes   Patient/family informed of their freedom to choose among providers that offer the needed level of care, that participate in Medicare, Medicaid or managed care program needed by the patient, have an available bed and are willing to accept the patient.  Yes   Patient/family informed of [facility]'s ownership interest in [facility] and [facility], as well as of the fact that they are under no obligation to receive care at these facilities.  PASRR submitted to EDS on 12/11/16     PASRR number received on 12/11/16     Existing PASRR number confirmed on       FL2 transmitted to all facilities in geographic area requested by pt/family on 12/11/16     FL2 transmitted to all facilities within larger geographic area on       Patient informed that his/her managed care company has contracts with or will negotiate with certain facilities, including the following:        Yes   Patient/family informed of bed offers received.  [facility]     Physician recommends and [facility]    [facility]11/16.  [facility]     Patient family notified on 12/11/16 of transfer.  Name of family member notified:   Daughter     PHYSICIAN Please sign DNR, Please sign FL2     Additional Comment:    _______________________________________________ [name], [name], LCSW 12/11/2016, 9:33 AM

## 2016-12-11 NOTE — Care Management Note (Signed)
Case Management Note Donn PieriniKristi Maxxon Schwanke RN, BSN Unit 2W-Case Manager [phone]  Patient Details  Name: [name] MRN: [id number] Date of Birth: [birth year]  Subjective/Objective:   Pt tx from [facility] with resp. Failure on Bipap-                  Action/Plan: PTA pt lived at home- PT eval recommendation for SNF- CSW consulted for placement needs- pt and family- wanting to get back to [facility]- choice for rehab- [facility]. PTA pt was not on any home 02- now on 2L.   Expected Discharge Date:  12/11/16               Expected Discharge Plan:  Skilled Nursing Facility  In-House Referral:  Clinical Social Work  Discharge planning Services  CM Consult  Post Acute Care Choice:    Choice offered to:     DME Arranged:    DME Agency:     HH Arranged:    HH Agency:     Status of Service:  Completed, signed off  If discussed at [employer] Length of Stay Meetings, dates discussed:    Discharge Disposition: skilled facility   Additional Comments:  Darrold SpanWebster, Caprisha Bridgett Hall, RN 12/11/2016, 10:42 AM

## 2016-12-11 NOTE — Progress Notes (Signed)
Phamacy note: Documentation for MI Core Measures  83 yo with NSTEMI and PAF on apixiban for anticoagulation.  She is noted with an aspirin allergy (anaphylaxis).  MI core measures recommend antiplatelet therapy (ex. Plavix) with NSTEMI.   Summary -Avoiding plavix due to concurrent apixiban and concern of bleeding risk.    Thank you, Harland Germanndrew Erin Obando, Pharm D 12/11/2016 9:37 AM

## 2016-12-11 NOTE — Progress Notes (Addendum)
Patient is medically stable for discharge to SNF today. [facility]. Call placed to facility to ensure bed, message left. Daughter has called this morning, updated information and has filled out all paperwork for placement.  DC summary compelted and faxed. Packet prepared for DC. Awaiting call from facility and then will call for transport. (facility has called and aware of patient coming.  Patient will be going to [address] [facility].  Report:  [phone]. DNR has been signed.  Transportation arranged by Interior and spatial designerDirector of SW. Patient to go by EMS. Daughter called and notified. Facility called and notified.  Will have RN call for report.  Deretha EmoryHannah Brittinee Risk LCSW, MSW Clinical Social Work: Optician, dispensingystem Wide Float Coverage for :  [phone]

## 2016-12-11 NOTE — Progress Notes (Signed)
RN bladder scanned patient as patient has not yet voided since foley catheter removed after 9 AM today. Bladder scan revealed 20 mL. Patient has not yet had a bowel movement today. Dr Elisabeth Pigeonevine stated this is OK for patient to still be discharged around noon, as patient has to leave before noon as directed by the facility. [facility]. IV and telemetry removed. Patient dressed. Kristi, case Production designer, theatre/television/filmmanager, notified of all of this and stated that SW would be placed transport request.

## 2017-01-16 DEATH — deceased

## 2017-11-11 IMAGING — CR DG CHEST 1V PORT
1 series · 1 of 1 positions shown · non-contrast
Comparison: None.

CLINICAL DATA: Shortness of Breath

EXAM:
PORTABLE CHEST 1 VIEW

[ap]
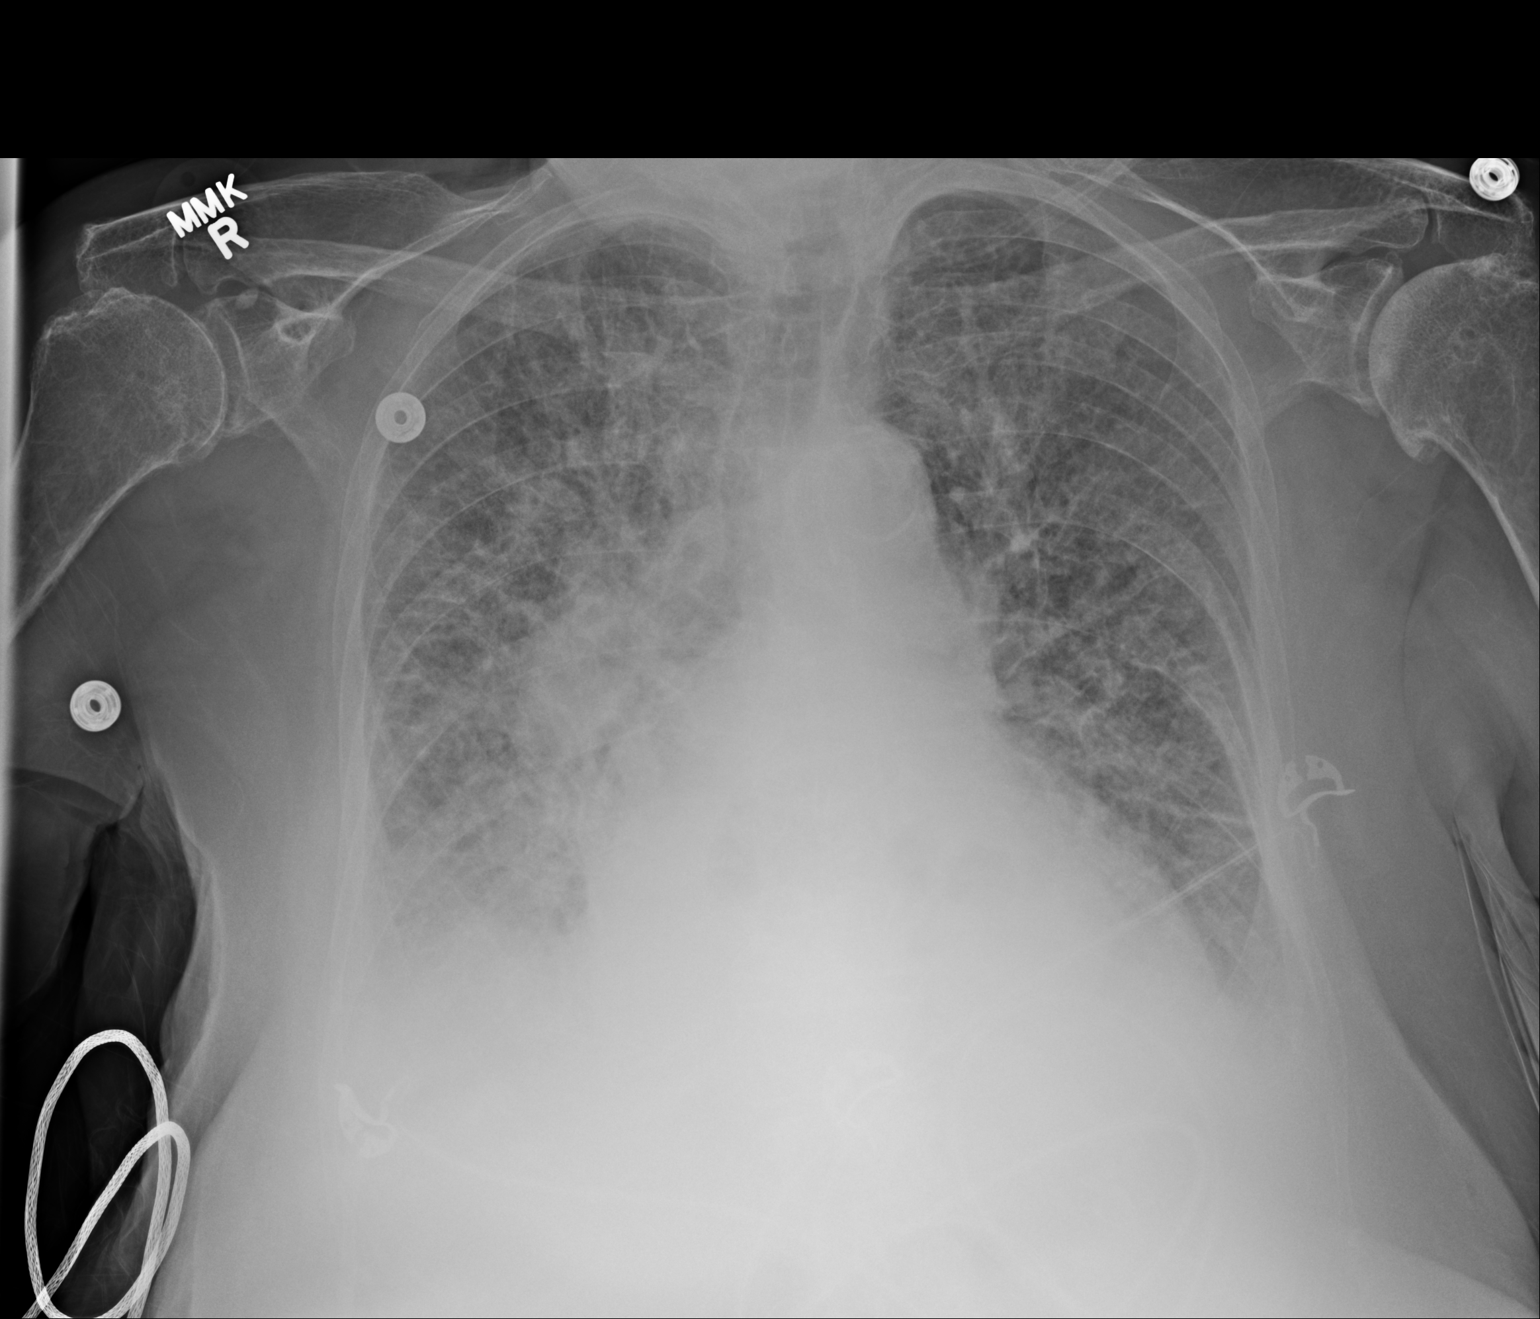

[1 of 1 positions shown; findings below may reference images not displayed]

FINDINGS: There is widespread interstitial edema. There are small pleural
effusions bilaterally. There is patchy airspace consolidation in the
bases. There is cardiomegaly with pulmonary venous hypertension.
There is aortic atherosclerosis. There is an apparent hiatal type
hernia. No adenopathy is appreciable. There is degenerative change
in each shoulder.
IMPRESSION: Evidence of congestive heart failure. Question alveolar edema versus
superimposed pneumonia in the bases. Both entities may exist
concurrently. There is aortic atherosclerosis. Apparent hiatal type
hernia.
